# Patient Record
Sex: Female | Born: 1965 | Race: White | Hispanic: No | Marital: Married | Smoking: Never smoker
Health system: Southern US, Community
[De-identification: ages and names within clinical notes are randomized; demographics above are authoritative.]

## PROBLEM LIST (undated history)

## (undated) DIAGNOSIS — I10 Essential (primary) hypertension: Secondary | ICD-10-CM | POA: Insufficient documentation

## (undated) DIAGNOSIS — J309 Allergic rhinitis, unspecified: Secondary | ICD-10-CM

## (undated) DIAGNOSIS — K219 Gastro-esophageal reflux disease without esophagitis: Secondary | ICD-10-CM

## (undated) DIAGNOSIS — E782 Mixed hyperlipidemia: Secondary | ICD-10-CM | POA: Insufficient documentation

## (undated) DIAGNOSIS — M722 Plantar fascial fibromatosis: Secondary | ICD-10-CM | POA: Insufficient documentation

## (undated) HISTORY — DX: Gastro-esophageal reflux disease without esophagitis: K21.9

## (undated) HISTORY — DX: Allergic rhinitis, unspecified: J30.9

---
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
## 2010-10-20 HISTORY — PX: CHOLECYSTECTOMY: SHX55

## 2019-01-06 DIAGNOSIS — E8881 Metabolic syndrome: Secondary | ICD-10-CM | POA: Insufficient documentation

## 2019-01-06 DIAGNOSIS — R7303 Prediabetes: Secondary | ICD-10-CM

## 2019-01-06 DIAGNOSIS — N3941 Urge incontinence: Secondary | ICD-10-CM | POA: Insufficient documentation

## 2019-01-06 HISTORY — DX: Prediabetes: R73.03

## 2019-11-15 ENCOUNTER — Other Ambulatory Visit: Payer: Self-pay | Admitting: Legal Medicine

## 2019-11-15 DIAGNOSIS — Z1231 Encounter for screening mammogram for malignant neoplasm of breast: Secondary | ICD-10-CM

## 2019-12-22 ENCOUNTER — Ambulatory Visit
Admission: RE | Admit: 2019-12-22 | Discharge: 2019-12-22 | Disposition: A | Discharge status: Home / Self Care | Payer: 59 | Source: Ambulatory Visit | Attending: Legal Medicine | Admitting: Legal Medicine

## 2019-12-22 ENCOUNTER — Other Ambulatory Visit: Payer: Self-pay

## 2019-12-22 DIAGNOSIS — Z1231 Encounter for screening mammogram for malignant neoplasm of breast: Secondary | ICD-10-CM

## 2019-12-24 NOTE — Progress Notes (Signed)
Patient needs ultrasound and diagnostic mammogram left breast lp

## 2019-12-26 ENCOUNTER — Other Ambulatory Visit: Payer: Self-pay | Admitting: Legal Medicine

## 2019-12-26 DIAGNOSIS — R928 Other abnormal and inconclusive findings on diagnostic imaging of breast: Secondary | ICD-10-CM

## 2020-02-06 ENCOUNTER — Ambulatory Visit: Payer: Self-pay | Admitting: Legal Medicine

## 2020-02-17 ENCOUNTER — Other Ambulatory Visit: Payer: Self-pay

## 2020-02-17 ENCOUNTER — Encounter: Payer: Self-pay | Admitting: Legal Medicine

## 2020-02-17 ENCOUNTER — Ambulatory Visit (INDEPENDENT_AMBULATORY_CARE_PROVIDER_SITE_OTHER): Payer: 59 | Admitting: Legal Medicine

## 2020-02-17 VITALS — BP 112/80 | HR 82 | Temp 96.5°F | Resp 17 | Ht 63.0 in | Wt 251.4 lb

## 2020-02-17 DIAGNOSIS — N951 Menopausal and female climacteric states: Secondary | ICD-10-CM | POA: Diagnosis not present

## 2020-02-17 DIAGNOSIS — G4733 Obstructive sleep apnea (adult) (pediatric): Secondary | ICD-10-CM | POA: Insufficient documentation

## 2020-02-17 DIAGNOSIS — R7303 Prediabetes: Secondary | ICD-10-CM

## 2020-02-17 DIAGNOSIS — F325 Major depressive disorder, single episode, in full remission: Secondary | ICD-10-CM

## 2020-02-17 DIAGNOSIS — I10 Essential (primary) hypertension: Secondary | ICD-10-CM

## 2020-02-17 DIAGNOSIS — E559 Vitamin D deficiency, unspecified: Secondary | ICD-10-CM

## 2020-02-17 DIAGNOSIS — N3281 Overactive bladder: Secondary | ICD-10-CM | POA: Diagnosis not present

## 2020-02-17 DIAGNOSIS — E782 Mixed hyperlipidemia: Secondary | ICD-10-CM

## 2020-02-17 HISTORY — DX: Menopausal and female climacteric states: N95.1

## 2020-02-17 HISTORY — DX: Obstructive sleep apnea (adult) (pediatric): G47.33

## 2020-02-17 HISTORY — DX: Major depressive disorder, single episode, in full remission: F32.5

## 2020-02-17 HISTORY — DX: Overactive bladder: N32.81

## 2020-02-17 HISTORY — DX: Morbid (severe) obesity due to excess calories: E66.01

## 2020-02-17 NOTE — Progress Notes (Signed)
Established Patient Office Visit  Subjective:  Patient ID: Modestine Scherzinger, female    DOB: 1965-11-09  Age: 54 y.o. MRN: [id number]  CC:  Chief Complaint  Patient presents with  . Depression  . Sleep Apnea    HPI Ambreen Tufte presents forchronic visit  This patient has major depression for 6 months.  PHQ9 =0.  Patient is having less anhedonia.  The patient has improved future plans and prospects.  The depression is worse with stress.  The patient is not exercising and working on behavior to improve mental health.  Patient is not seeing a therapist or psychiatrist.  na  Patient is on none   Patient has gastroesophageal reflux symptoms withesophagitis and LTRD.  The symptoms are mild intensity.  Length of symptoms 5 years.  Medicines include OTC.  Complications include none.  Past Medical History:  Diagnosis Date  . Allergic rhinitis 10/20/1898  . Gastroesophageal reflux disease 10/20/1898  . Major depressive disorder, single episode, in remission (HCC) 02/17/2020  . Menopausal and female climacteric states 02/17/2020  . Morbid (severe) obesity due to excess calories (HCC) 02/17/2020  . Obstructive sleep apnea (adult) (pediatric) 02/17/2020  . Overactive bladder 02/17/2020  . Prediabetes 01/06/2019    History reviewed. No pertinent surgical history.  History reviewed. No pertinent family history.  Social History   Socioeconomic History  . Marital status: Unknown    Spouse name: Not on file  . Number of children: Not on file  . Years of education: Not on file  . Highest education level: Not on file  Occupational History  . Not on file  Tobacco Use  . Smoking status: Never Smoker  . Smokeless tobacco: Never Used  Substance and Sexual Activity  . Alcohol use: Yes    Alcohol/week: 2.0 standard drinks    Types: 2 Shots of liquor per week    Comment: every 2 weeks  . Drug use: Never  . Sexual activity: Yes    Partners: Male  Other Topics Concern  . Not on file  Social History  Narrative  . Not on file   Social Determinants of Health   Financial Resource Strain:   . Difficulty of Paying Living Expenses:   Food Insecurity:   . Worried About Programme researcher, broadcasting/film/video in the Last Year:   . Barista in the Last Year:   Transportation Needs:   . Freight forwarder (Medical):   Marland Kitchen Lack of Transportation (Non-Medical):   Physical Activity:   . Days of Exercise per Week:   . Minutes of Exercise per Session:   Stress:   . Feeling of Stress :   Social Connections:   . Frequency of Communication with Friends and Family:   . Frequency of Social Gatherings with Friends and Family:   . Attends Religious Services:   . Active Member of Clubs or Organizations:   . Attends Banker Meetings:   Marland Kitchen Marital Status:   Intimate Partner Violence:   . Fear of Current or Ex-Partner:   . Emotionally Abused:   Marland Kitchen Physically Abused:   . Sexually Abused:     Outpatient Medications Prior to Visit  Medication Sig Dispense Refill  . aspirin 81 MG EC tablet Take by mouth.    Marland Kitchen atorvastatin (LIPITOR) 40 MG tablet Take 40 mg by mouth daily.    . Cholecalciferol 125 MCG (5000 UT) TABS daily.    . Cobalamin Combinations (VITAMIN B12-FOLIC ACID) 500-400 MCG TABS daily.    Marland Kitchen  estradiol-norethindrone (ACTIVELLA) 1-0.5 MG tablet Take 1 tablet by mouth daily.    . Flaxseed, Linseed, (FLAX SEED OIL) 1000 MG CAPS flaxseed 1,000 mg capsule  Take by oral route.    Marland Kitchen lisinopril (ZESTRIL) 20 MG tablet Take 20 mg by mouth daily.    . Omega-3 Fatty Acids (FISH OIL) 1000 MG CAPS Take by mouth.     No facility-administered medications prior to visit.    No Known Allergies  ROS Review of Systems  Constitutional: Negative.   HENT: Negative.   Eyes: Negative.   Respiratory: Negative.   Cardiovascular: Negative.   Gastrointestinal: Negative.   Genitourinary: Negative.   Musculoskeletal: Negative.   Skin: Negative.   Neurological: Negative.   Psychiatric/Behavioral:  Negative.       Objective:    Physical Exam  Constitutional: She is oriented to person, place, and time. She appears well-developed and well-nourished.  HENT:  Head: Normocephalic and atraumatic.  Right Ear: External ear normal.  Left Ear: External ear normal.  Nose: Nose normal.  Mouth/Throat: Oropharynx is clear and moist.  Eyes: Pupils are equal, round, and reactive to light. Conjunctivae and EOM are normal.  Cardiovascular: Normal rate, regular rhythm, normal heart sounds and intact distal pulses.  Pulmonary/Chest: Effort normal and breath sounds normal.  Abdominal: Soft. Bowel sounds are normal.  Musculoskeletal:        General: Normal range of motion.     Cervical back: Normal range of motion and neck supple.  Neurological: She is alert and oriented to person, place, and time. She has normal reflexes.  Skin: Skin is warm and dry.  Psychiatric: She has a normal mood and affect. Her behavior is normal.  Vitals reviewed.   BP 112/80 (BP Location: Right Arm, Patient Position: Sitting)   Pulse 82   Temp (!) 96.5 F (35.8 C) (Temporal)   Resp 17   Ht 5\' 3"  (1.6 m)   Wt 251 lb 6.4 oz (114 kg)   BMI 44.53 kg/m  Wt Readings from Last 3 Encounters:  02/17/20 251 lb 6.4 oz (114 kg)     Health Maintenance Due  Topic Date Due  . HIV Screening  Never done  . COVID-19 Vaccine (1) Never done  . TETANUS/TDAP  Never done  . PAP SMEAR-Modifier  Never done  . COLONOSCOPY  Never done    There are no preventive care reminders to display for this patient.  No results found for: TSH Lab Results  Component Value Date   WBC 7.7 02/17/2020   HGB 15.0 02/17/2020   HCT 45.9 02/17/2020   MCV 95 02/17/2020   PLT 282 02/17/2020   Lab Results  Component Value Date   NA 142 02/17/2020   K 5.4 (H) 02/17/2020   CO2 23 02/17/2020   GLUCOSE 107 (H) 02/17/2020   BUN 16 02/17/2020   CREATININE 0.81 02/17/2020   BILITOT 0.7 02/17/2020   ALKPHOS 72 02/17/2020   AST 21 02/17/2020    ALT 33 (H) 02/17/2020   PROT 7.2 02/17/2020   ALBUMIN 4.5 02/17/2020   CALCIUM 10.2 02/17/2020   Lab Results  Component Value Date   CHOL 177 02/17/2020   Lab Results  Component Value Date   HDL 59 02/17/2020   Lab Results  Component Value Date   LDLCALC 101 (H) 02/17/2020   Lab Results  Component Value Date   TRIG 94 02/17/2020   Lab Results  Component Value Date   CHOLHDL 3.0 02/17/2020   Lab Results  Component Value Date   HGBA1C 5.7 (H) 02/17/2020      Assessment & Plan:   Problem List Items Addressed This Visit      Cardiovascular and Mediastinum   Benign essential hypertension    An individual hypertension care plan was established and reinforced today.  The patient's status was assessed using clinical findings on exam and labs or diagnostic tests. The patient's success at meeting treatment goals on disease specific evidence-based guidelines and found to be well controlled. SELF MANAGEMENT: The patient and I together assessed ways to personally work towards obtaining the recommended goals. RECOMMENDATIONS: avoid decongestants found in common cold remedies, decrease consumption of alcohol, perform routine monitoring of BP with home BP cuff, exercise, reduction of dietary salt, take medicines as prescribed, try not to miss doses and quit smoking.  Regular exercise and maintaining a healthy weight is needed.  Stress reduction may help. A CLINICAL SUMMARY including written plan identify barriers to care unique to individual due to social or financial issues.  We attempt to mutually creat solutions for individual and family understanding.      Relevant Medications   aspirin 81 MG EC tablet   atorvastatin (LIPITOR) 40 MG tablet   lisinopril (ZESTRIL) 20 MG tablet   Other Relevant Orders   CBC with Differential/Platelet (Completed)   Comprehensive metabolic panel (Completed)     Genitourinary   Overactive bladder    AN INDIVIDUAL CARE PLAN for OAB was established  and reinforced today.  The patient's status was assessed using clinical findings on exam, labs, and other diagnostic testing. Patient's success at meeting treatment goals based on disease specific evidence-bassed guidelines and found to be in good control. RECOMMENDATIONS include maintaining present medicines and treatment.        Other   Mixed hyperlipidemia    AN INDIVIDUAL CARE PLAN for hyperlipidemia/ cholesterol was established and reinforced today.  The patient's status was assessed using clinical findings on exam, lab and other diagnostic tests. The patient's disease status was assessed based on evidence-based guidelines and found to be well controlled. MEDICATIONS were reviewed. SELF MANAGEMENT GOALS have been discussed and patient's success at attaining the goal of low cholesterol was assessed. RECOMMENDATION given include regular exercise 3 days a week and low cholesterol/low fat diet. CLINICAL SUMMARY including written plan to identify barriers unique to the patient due to social or economic  reasons was discussed.      Relevant Medications   aspirin 81 MG EC tablet   atorvastatin (LIPITOR) 40 MG tablet   lisinopril (ZESTRIL) 20 MG tablet   Other Relevant Orders   Lipid panel (Completed)   Prediabetes - Primary    Patient has prediabetes and is staying on diet.  NO adverse effects,      Relevant Orders   Hemoglobin A1c (Completed)   Major depressive disorder, single episode, in remission (HCC)    Patient's depression is controlled with no medicines.   Anhedonia better.  PHQ 9 was was performed score 0. An individual care plan was established or reinforced today.  The patient's disease status was assessed using clinical findings on exam, labs, and or other diagnostic testing to determine patient's success in meeting treatment goals based on disease specific evidence-based guidelines and found to be improving Recommendations include stay off medicines      Menopausal and  female climacteric states    AN INDIVIDUAL CARE PLAN for menopausal symptoms was established and reinforced today.  The patient's status was assessed using clinical findings  on exam, labs, and other diagnostic testing. Patient's success at meeting treatment goals based on disease specific evidence-bassed guidelines and found to be in good control. RECOMMENDATIONS include maintaining present medicines and treatment.      Morbid (severe) obesity due to excess calories [facility])    An individualize plan was formulated using patient history and physical exam to encourage weight loss.  An evidence based program was formulated.  Patient is to cut portion size with meals and to plan physical exercise 3 days a week at least 20 minutes.  Weight watchers and other programs are helpful.  Planned amount of weight loss 10 lbs.         No orders of the defined types were placed in this encounter.   Follow-up: Return in about 4 months (around 06/18/2020) for fasting.    Brent Bulla, MD

## 2020-02-18 LAB — CBC WITH DIFFERENTIAL/PLATELET
Basophils Absolute: 0 10*3/uL (ref 0.0–0.2)
Basos: 0 %
EOS (ABSOLUTE): 0.1 10*3/uL (ref 0.0–0.4)
Eos: 2 %
Hematocrit: 45.9 % (ref 34.0–46.6)
Hemoglobin: 15 g/dL (ref 11.1–15.9)
Immature Grans (Abs): 0 10*3/uL (ref 0.0–0.1)
Immature Granulocytes: 0 %
Lymphocytes Absolute: 2.2 10*3/uL (ref 0.7–3.1)
Lymphs: 28 %
MCH: 31.1 pg (ref 26.6–33.0)
MCHC: 32.7 g/dL (ref 31.5–35.7)
MCV: 95 fL (ref 79–97)
Monocytes Absolute: 0.6 10*3/uL (ref 0.1–0.9)
Monocytes: 7 %
Neutrophils Absolute: 4.8 10*3/uL (ref 1.4–7.0)
Neutrophils: 63 %
Platelets: 282 10*3/uL (ref 150–450)
RBC: 4.83 x10E6/uL (ref 3.77–5.28)
RDW: 12.2 % (ref 11.7–15.4)
WBC: 7.7 10*3/uL (ref 3.4–10.8)

## 2020-02-18 LAB — LIPID PANEL
Chol/HDL Ratio: 3 ratio (ref 0.0–4.4)
Cholesterol, Total: 177 mg/dL (ref 100–199)
HDL: 59 mg/dL (ref >39)
LDL Chol Calc (NIH): 101 mg/dL — ABNORMAL HIGH (ref 0–99)
Triglycerides: 94 mg/dL (ref 0–149)
VLDL Cholesterol Cal: 17 mg/dL (ref 5–40)

## 2020-02-18 LAB — COMPREHENSIVE METABOLIC PANEL
ALT: 33 IU/L — ABNORMAL HIGH (ref 0–32)
AST: 21 IU/L (ref 0–40)
Albumin/Globulin Ratio: 1.7 (ref 1.2–2.2)
Albumin: 4.5 g/dL (ref 3.8–4.9)
Alkaline Phosphatase: 72 IU/L (ref 39–117)
BUN/Creatinine Ratio: 20 (ref 9–23)
BUN: 16 mg/dL (ref 6–24)
Bilirubin Total: 0.7 mg/dL (ref 0.0–1.2)
CO2: 23 mmol/L (ref 20–29)
Calcium: 10.2 mg/dL (ref 8.7–10.2)
Chloride: 104 mmol/L (ref 96–106)
Creatinine, Ser: 0.81 mg/dL (ref 0.57–1.00)
GFR calc Af Amer: 97 mL/min/{1.73_m2} (ref >59)
GFR calc non Af Amer: 84 mL/min/{1.73_m2} (ref >59)
Globulin, Total: 2.7 g/dL (ref 1.5–4.5)
Glucose: 107 mg/dL — ABNORMAL HIGH (ref 65–99)
Potassium: 5.4 mmol/L — ABNORMAL HIGH (ref 3.5–5.2)
Sodium: 142 mmol/L (ref 134–144)
Total Protein: 7.2 g/dL (ref 6.0–8.5)

## 2020-02-18 LAB — HEMOGLOBIN A1C
Est. average glucose Bld gHb Est-mCnc: 117 mg/dL
Hgb A1c MFr Bld: 5.7 % — ABNORMAL HIGH (ref 4.8–5.6)

## 2020-02-18 LAB — CARDIOVASCULAR RISK ASSESSMENT

## 2020-02-18 NOTE — Progress Notes (Signed)
CBC normal, glucose 107, potassium high at 5.4, one liver tests high, A1c 5.7- recheck  one week lp

## 2020-02-19 NOTE — Assessment & Plan Note (Signed)
AN INDIVIDUAL CARE PLAN for OAB was established and reinforced today.  The patient's status was assessed using clinical findings on exam, labs, and other diagnostic testing. Patient's success at meeting treatment goals based on disease specific evidence-bassed guidelines and found to be in good control. RECOMMENDATIONS include maintaining present medicines and treatment.

## 2020-02-19 NOTE — Assessment & Plan Note (Signed)
Patient's depression is controlled with no medicines.   Anhedonia better.  PHQ 9 was was performed score 0. An individual care plan was established or reinforced today.  The patient's disease status was assessed using clinical findings on exam, labs, and or other diagnostic testing to determine patient's success in meeting treatment goals based on disease specific evidence-based guidelines and found to be improving Recommendations include stay off medicines

## 2020-02-19 NOTE — Assessment & Plan Note (Signed)
An individualize plan was formulated using patient history and physical exam to encourage weight loss.  An evidence based program was formulated.  Patient is to cut portion size with meals and to plan physical exercise 3 days a week at least 20 minutes.  Weight watchers and other programs are helpful.  Planned amount of weight loss 10 lbs. 

## 2020-02-19 NOTE — Assessment & Plan Note (Signed)
AN INDIVIDUAL CARE PLAN for menopausal symptoms was established and reinforced today.  The patient's status was assessed using clinical findings on exam, labs, and other diagnostic testing. Patient's success at meeting treatment goals based on disease specific evidence-bassed guidelines and found to be in good control. RECOMMENDATIONS include maintaining present medicines and treatment.

## 2020-02-19 NOTE — Assessment & Plan Note (Signed)

## 2020-02-19 NOTE — Assessment & Plan Note (Signed)

## 2020-02-19 NOTE — Assessment & Plan Note (Signed)
Patient has prediabetes and is staying on diet.  NO adverse effects,

## 2020-02-20 ENCOUNTER — Other Ambulatory Visit: Payer: Self-pay | Admitting: Legal Medicine

## 2020-02-28 ENCOUNTER — Other Ambulatory Visit: Payer: Self-pay | Admitting: Legal Medicine

## 2020-02-28 DIAGNOSIS — I10 Essential (primary) hypertension: Secondary | ICD-10-CM

## 2020-05-14 ENCOUNTER — Other Ambulatory Visit: Payer: Self-pay | Admitting: Legal Medicine

## 2020-05-14 DIAGNOSIS — N951 Menopausal and female climacteric states: Secondary | ICD-10-CM

## 2020-05-25 ENCOUNTER — Other Ambulatory Visit: Payer: Self-pay

## 2020-05-25 DIAGNOSIS — E782 Mixed hyperlipidemia: Secondary | ICD-10-CM

## 2020-05-25 DIAGNOSIS — I10 Essential (primary) hypertension: Secondary | ICD-10-CM

## 2020-05-25 MED ORDER — LISINOPRIL 20 MG PO TABS: 20.0000 mg | ORAL_TABLET | Freq: Every day | ORAL | 1 refills | Status: DC

## 2020-05-25 MED ORDER — ATORVASTATIN CALCIUM 40 MG PO TABS: 40.0000 mg | ORAL_TABLET | Freq: Every day | ORAL | 1 refills | Status: DC

## 2020-05-30 ENCOUNTER — Ambulatory Visit (INDEPENDENT_AMBULATORY_CARE_PROVIDER_SITE_OTHER): Payer: 59 | Admitting: Legal Medicine

## 2020-05-30 ENCOUNTER — Other Ambulatory Visit: Payer: Self-pay

## 2020-05-30 ENCOUNTER — Encounter: Payer: Self-pay | Admitting: Legal Medicine

## 2020-05-30 DIAGNOSIS — R7303 Prediabetes: Secondary | ICD-10-CM

## 2020-05-30 DIAGNOSIS — Z0000 Encounter for general adult medical examination without abnormal findings: Secondary | ICD-10-CM

## 2020-05-30 DIAGNOSIS — E782 Mixed hyperlipidemia: Secondary | ICD-10-CM

## 2020-05-30 DIAGNOSIS — I10 Essential (primary) hypertension: Secondary | ICD-10-CM

## 2020-05-30 DIAGNOSIS — Z6841 Body Mass Index (BMI) 40.0 and over, adult: Secondary | ICD-10-CM | POA: Diagnosis not present

## 2020-05-30 DIAGNOSIS — Z01419 Encounter for gynecological examination (general) (routine) without abnormal findings: Secondary | ICD-10-CM

## 2020-05-30 MED ORDER — ACCU-CHEK GUIDE VI STRP: ORAL_STRIP | 12 refills | Status: AC

## 2020-05-30 MED ORDER — ACCU-CHEK GUIDE VI STRP: ORAL_STRIP | 12 refills | Status: DC

## 2020-05-30 NOTE — Patient Instructions (Signed)

## 2020-05-30 NOTE — Progress Notes (Signed)
Subjective:  Patient ID: Laura Hubbard, female    DOB: 1966-08-08  Age: 54 y.o. MRN: [id number]  Chief Complaint  Patient presents with  . Annual Exam    Physical exam for work.    HPI Encounter for general adult medical examination without abnormal findings  Physical ("At Risk" items are starred): Patient's last physical exam was 1 year ago .  Weight: Appropriate for height (BMI less than 27%) ;  Blood Pressure: Normal (BP less than 120/80) ;  Medical History: Patient history reviewed ; Family history reviewed ;  Allergies Reviewed: No change in current allergies ;  Medications Reviewed: Medications reviewed - no changes ;  Lipids: Normal lipid levels ;  Smoking: Life-long non-smoker ;  Physical Activity: Exercises at least 3 times per week ;  Alcohol/Drug Use: Is a non-drinker ; No illicit drug use ;  Patient is not afflicted from Stress Incontinence and Urge Incontinence  Safety: reviewed ; Patient wears a seat belt, has smoke detectors, has carbon monoxide detectors, practices appropriate gun safety, and wears sunscreen with extended sun exposure. Dental Care: biannual cleanings, brushes and flosses daily. Ophthalmology/Optometry: Annual visit.  Hearing loss: none Vision impairments: none  Patient presents for follow up of hypertension.  Patient tolerating lisinopril well with side effects.  Patient was diagnosed with hypertension 2010 so has been treated for hypertension for 10 years.Patient is working on maintaining diet and exercise regimen and follows up as directed. Complication include none  Patient presents with hyperlipidemia.  Compliance with treatment has been good; patient takes medicines as directed, maintains low cholesterol diet, follows up as directed, and maintains exercise regimen.  Patient is using aorvastatin without problems..  Prediabetes doing well, checks sugars  Fall Risk  05/30/2020  Falls in the past year? 0  Number falls in past yr: 0  Injury with  Fall? 0    Mammogram normal  Depression screen [facility] 2/9 05/30/2020 02/20/2020  Decreased Interest 0 0  Down, Depressed, Hopeless 0 0  PHQ - 2 Score 0 0  Altered sleeping 0 0  Tired, decreased energy 0 0  Change in appetite 0 0  Feeling bad or failure about yourself  0 0  Trouble concentrating 0 0  Moving slowly or fidgety/restless 0 0  Suicidal thoughts 0 0  PHQ-9 Score 0 0  Difficult doing work/chores - Not difficult at all       Functional Status Survey: done Is the patient deaf or have difficulty hearing?: No Does the patient have difficulty seeing, even when wearing glasses/contacts?: No Does the patient have difficulty concentrating, remembering, or making decisions?: No Does the patient have difficulty walking or climbing stairs?: No Does the patient have difficulty dressing or bathing?: No Does the patient have difficulty doing errands alone such as visiting a doctor's office or shopping?: No   Social Hx   Social History   Socioeconomic History  . Marital status: Unknown    Spouse name: Not on file  . Number of children: Not on file  . Years of education: Not on file  . Highest education level: Not on file  Occupational History  . Not on file  Tobacco Use  . Smoking status: Never Smoker  . Smokeless tobacco: Never Used  Substance and Sexual Activity  . Alcohol use: Yes    Alcohol/week: 2.0 standard drinks    Types: 2 Shots of liquor per week    Comment: every 2 weeks  . Drug use: Never  . Sexual activity: Yes  Partners: Male  Other Topics Concern  . Not on file  Social History Narrative  . Not on file   Social Determinants of Health   Financial Resource Strain:   . Difficulty of Paying Living Expenses:   Food Insecurity:   . Worried About Programme researcher, broadcasting/film/video in the Last Year:   . Barista in the Last Year:   Transportation Needs:   . Freight forwarder (Medical):   Marland Kitchen Lack of Transportation (Non-Medical):   Physical Activity:   . Days  of Exercise per Week:   . Minutes of Exercise per Session:   Stress:   . Feeling of Stress :   Social Connections:   . Frequency of Communication with Friends and Family:   . Frequency of Social Gatherings with Friends and Family:   . Attends Religious Services:   . Active Member of Clubs or Organizations:   . Attends Banker Meetings:   Marland Kitchen Marital Status:    Past Medical History:  Diagnosis Date  . Allergic rhinitis 10/20/1898  . Gastroesophageal reflux disease 10/20/1898  . Major depressive disorder, single episode, in remission (HCC) 02/17/2020  . Menopausal and female climacteric states 02/17/2020  . Morbid (severe) obesity due to excess calories (HCC) 02/17/2020  . Obstructive sleep apnea (adult) (pediatric) 02/17/2020  . Overactive bladder 02/17/2020  . Prediabetes 01/06/2019   History reviewed. No pertinent family history.  Review of Systems  Constitutional: Negative.   HENT: Negative.   Eyes: Negative.   Respiratory: Negative.   Cardiovascular: Negative.   Gastrointestinal: Negative.   Endocrine: Negative.   Genitourinary: Negative.   Musculoskeletal: Negative.   Skin: Negative.   Neurological: Negative.   Hematological: Negative.   Psychiatric/Behavioral: Negative.      Objective:  BP 120/90 (BP Location: Right Arm, Patient Position: Sitting)   Pulse 94   Temp 97.8 F (36.6 C) (Temporal)   Resp 16   Ht 5\' 3"  (1.6 m)   Wt 251 lb 9.6 oz (114.1 kg)   SpO2 97%   BMI 44.57 kg/m   BP/Weight 05/30/2020 02/17/2020  Systolic BP 120 112  Diastolic BP 90 80  Wt. (Lbs) 251.6 251.4  BMI 44.57 44.53    Physical Exam Vitals reviewed. Exam conducted with a chaperone present.  Constitutional:      Appearance: Normal appearance.  HENT:     Head: Normocephalic.     Right Ear: Tympanic membrane, ear canal and external ear normal.     Left Ear: Tympanic membrane, ear canal and external ear normal.     Nose: Nose normal.     Mouth/Throat:     Mouth: Mucous  membranes are moist.  Eyes:     Extraocular Movements: Extraocular movements intact.     Conjunctiva/sclera: Conjunctivae normal.     Pupils: Pupils are equal, round, and reactive to light.  Cardiovascular:     Rate and Rhythm: Normal rate.     Pulses: Normal pulses.     Heart sounds: Normal heart sounds.  Pulmonary:     Effort: Pulmonary effort is normal.     Breath sounds: Normal breath sounds.  Abdominal:     General: Abdomen is flat. Bowel sounds are normal.     Palpations: Abdomen is soft.     Hernia: There is no hernia in the left inguinal area or right inguinal area.  Genitourinary:    Exam position: Lithotomy position.     Tanner stage (genital): 5.  Labia:        Right: No rash, tenderness, lesion or injury.        Left: No rash, tenderness, lesion or injury.      Urethra: No prolapse, urethral pain, urethral swelling or urethral lesion.     Comments: Uterus antiverted, PAP performed Musculoskeletal:        General: Normal range of motion.     Cervical back: Normal range of motion and neck supple.  Lymphadenopathy:     Lower Body: No right inguinal adenopathy. No left inguinal adenopathy.  Skin:    General: Skin is warm.     Capillary Refill: Capillary refill takes less than 2 seconds.  Neurological:     General: No focal deficit present.     Mental Status: She is alert. Mental status is at baseline.  Psychiatric:        Mood and Affect: Mood normal.        Thought Content: Thought content normal.        Judgment: Judgment normal.     Lab Results  Component Value Date   WBC 7.7 02/17/2020   HGB 15.0 02/17/2020   HCT 45.9 02/17/2020   PLT 282 02/17/2020   GLUCOSE 107 (H) 02/17/2020   CHOL 177 02/17/2020   TRIG 94 02/17/2020   HDL 59 02/17/2020   LDLCALC 101 (H) 02/17/2020   ALT 33 (H) 02/17/2020   AST 21 02/17/2020   NA 142 02/17/2020   K 5.4 (H) 02/17/2020   CL 104 02/17/2020   CREATININE 0.81 02/17/2020   BUN 16 02/17/2020   CO2 23 02/17/2020     HGBA1C 5.7 (H) 02/17/2020      Assessment & Plan:  1. BMI 40.0-44.9, adult [facility]) An individualize plan was formulated for obesity using patient history and physical exam to encourage weight loss.  An evidence based program was formulated.  Patient is to cut portion size with meals and to plan physical exercise 3 days a week at least 20 minutes.  Weight watchers and other programs are helpful.  Planned amount of weight loss 10 lbs.  2. Morbid (severe) obesity due to excess calories [facility]) An individualize plan was formulated for obesity using patient history and physical exam to encourage weight loss.  An evidence based program was formulated.  Patient is to cut portion size with meals and to plan physical exercise 3 days a week at least 20 minutes.  Weight watchers and other programs are helpful.  Planned amount of weight loss 10 lbs.  3. Mixed hyperlipidemia - Lipid panel AN INDIVIDUAL CARE PLAN for hyperlipidemia/ cholesterol was established and reinforced today.  The patient's status was assessed using clinical findings on exam, lab and other diagnostic tests. The patient's disease status was assessed based on evidence-based guidelines and found to be fair controlled. MEDICATIONS were reviewed. SELF MANAGEMENT GOALS have been discussed and patient's success at attaining the goal of low cholesterol was assessed. RECOMMENDATION given include regular exercise 3 days a week and low cholesterol/low fat diet. CLINICAL SUMMARY including written plan to identify barriers unique to the patient due to social or economic  reasons was discussed.  4. Benign essential hypertension - CBC with Differential/Platelet - Comprehensive metabolic panel An individual hypertension care plan was established and reinforced today.  The patient's status was assessed using clinical findings on exam and labs or diagnostic tests. The patient's success at meeting treatment goals on disease specific evidence-based  guidelines and found to be well controlled. SELF MANAGEMENT:  The patient and I together assessed ways to personally work towards obtaining the recommended goals. RECOMMENDATIONS: avoid decongestants found in common cold remedies, decrease consumption of alcohol, perform routine monitoring of BP with home BP cuff, exercise, reduction of dietary salt, take medicines as prescribed, try not to miss doses and quit smoking.  Regular exercise and maintaining a healthy weight is needed.  Stress reduction may help. A CLINICAL SUMMARY including written plan identify barriers to care unique to individual due to social or financial issues.  We attempt to mutually creat solutions for individual and family understanding.  5. Prediabetes - Hemoglobin A1c - glucose blood (ACCU-CHEK GUIDE) test strip; One time a day fasting.  Dispense: 100 each; Refill: 12 Patient needs to keep check of her BS and lose weight  6. Encounter for cervical Pap smear with pelvic exam - IGP,CtNgTv,Apt HPV,rfx16/18,45 PAP performed  7. Routine general medical examination at a health care facility Routine exam performed, we discussed weight loss and need to keep her glucose controlled, information given    Meds ordered this encounter  Medications  . DISCONTD: glucose blood (ACCU-CHEK GUIDE) test strip    Sig: Use as instructed    Dispense:  100 each    Refill:  12  . glucose blood (ACCU-CHEK GUIDE) test strip    Sig: One time a day fasting.    Dispense:  100 each    Refill:  12    These are the goals we discussed: Goals    . DIET - REDUCE PORTION SIZE        This is a list of the screening recommended for you and due dates:  Health Maintenance  Topic Date Due  .  Hepatitis C: One time screening is recommended by Center for Disease Control  (CDC) for  adults born from [birth year] through 1963.   Never done  . HIV Screening  Never done  . Tetanus Vaccine  Never done  . Pap Smear  Never done  . Flu Shot  05/20/2020  .  COVID-19 Vaccine (2 - Pfizer 2-dose series) 06/14/2020  . Mammogram  12/21/2021  . Colon Cancer Screening  08/06/2028     AN INDIVIDUALIZED CARE PLAN: was established or reinforced today.   SELF MANAGEMENT: The patient and I together assessed ways to personally work towards obtaining the recommended goals  Support needs The patient and/or family needs were assessed and services were offered and not necessary at this time.    Follow-up: Return in about 4 months (around 09/29/2020) for fasting. Brent BullaLawrence Mckaylee [facility] [phone]

## 2020-05-31 LAB — LIPID PANEL
Chol/HDL Ratio: 2.7 ratio (ref 0.0–4.4)
Cholesterol, Total: 173 mg/dL (ref 100–199)
HDL: 65 mg/dL (ref >39)
LDL Chol Calc (NIH): 96 mg/dL (ref 0–99)
Triglycerides: 64 mg/dL (ref 0–149)
VLDL Cholesterol Cal: 12 mg/dL (ref 5–40)

## 2020-05-31 LAB — COMPREHENSIVE METABOLIC PANEL
ALT: 34 IU/L — ABNORMAL HIGH (ref 0–32)
AST: 22 IU/L (ref 0–40)
Albumin/Globulin Ratio: 1.6 (ref 1.2–2.2)
Albumin: 4.5 g/dL (ref 3.8–4.9)
Alkaline Phosphatase: 65 IU/L (ref 48–121)
BUN/Creatinine Ratio: 21 (ref 9–23)
BUN: 17 mg/dL (ref 6–24)
Bilirubin Total: 0.6 mg/dL (ref 0.0–1.2)
CO2: 26 mmol/L (ref 20–29)
Calcium: 10.5 mg/dL — ABNORMAL HIGH (ref 8.7–10.2)
Chloride: 106 mmol/L (ref 96–106)
Creatinine, Ser: 0.8 mg/dL (ref 0.57–1.00)
GFR calc Af Amer: 98 mL/min/{1.73_m2} (ref >59)
GFR calc non Af Amer: 85 mL/min/{1.73_m2} (ref >59)
Globulin, Total: 2.8 g/dL (ref 1.5–4.5)
Glucose: 115 mg/dL — ABNORMAL HIGH (ref 65–99)
Potassium: 5.6 mmol/L — ABNORMAL HIGH (ref 3.5–5.2)
Sodium: 144 mmol/L (ref 134–144)
Total Protein: 7.3 g/dL (ref 6.0–8.5)

## 2020-05-31 LAB — CBC WITH DIFFERENTIAL/PLATELET
Basophils Absolute: 0.1 10*3/uL (ref 0.0–0.2)
Basos: 1 %
EOS (ABSOLUTE): 0.3 10*3/uL (ref 0.0–0.4)
Eos: 3 %
Hematocrit: 46.5 % (ref 34.0–46.6)
Hemoglobin: 15 g/dL (ref 11.1–15.9)
Immature Grans (Abs): 0 10*3/uL (ref 0.0–0.1)
Immature Granulocytes: 0 %
Lymphocytes Absolute: 2.3 10*3/uL (ref 0.7–3.1)
Lymphs: 27 %
MCH: 30.6 pg (ref 26.6–33.0)
MCHC: 32.3 g/dL (ref 31.5–35.7)
MCV: 95 fL (ref 79–97)
Monocytes Absolute: 0.7 10*3/uL (ref 0.1–0.9)
Monocytes: 8 %
Neutrophils Absolute: 5.4 10*3/uL (ref 1.4–7.0)
Neutrophils: 61 %
Platelets: 312 10*3/uL (ref 150–450)
RBC: 4.9 x10E6/uL (ref 3.77–5.28)
RDW: 12.3 % (ref 11.7–15.4)
WBC: 8.7 10*3/uL (ref 3.4–10.8)

## 2020-05-31 LAB — CARDIOVASCULAR RISK ASSESSMENT

## 2020-05-31 LAB — HEMOGLOBIN A1C
Est. average glucose Bld gHb Est-mCnc: 120 mg/dL
Hgb A1c MFr Bld: 5.8 % — ABNORMAL HIGH (ref 4.8–5.6)

## 2020-05-31 NOTE — Progress Notes (Signed)
CBC normal, glucose 115, potassium 5.6- needs repeat in one week, calcium up, one liver test needed, Cholesterol normal, A1c 5.8 lp

## 2020-06-04 LAB — IGP,CTNGTV,APT HPV,RFX16/18,45
Chlamydia, Nuc. Acid Amp: NEGATIVE
Gonococcus, Nuc. Acid Amp: NEGATIVE
HPV Aptima: NEGATIVE
PAP Smear Comment: 0
Trich vag by NAA: NEGATIVE

## 2020-06-04 NOTE — Progress Notes (Signed)
Normal PAP lp

## 2020-06-05 ENCOUNTER — Encounter: Payer: Self-pay | Admitting: Legal Medicine

## 2020-06-18 ENCOUNTER — Other Ambulatory Visit: Payer: Self-pay

## 2020-06-18 ENCOUNTER — Ambulatory Visit (INDEPENDENT_AMBULATORY_CARE_PROVIDER_SITE_OTHER): Payer: 59 | Admitting: Legal Medicine

## 2020-06-18 ENCOUNTER — Encounter: Payer: Self-pay | Admitting: Legal Medicine

## 2020-06-18 DIAGNOSIS — I10 Essential (primary) hypertension: Secondary | ICD-10-CM | POA: Diagnosis not present

## 2020-06-18 DIAGNOSIS — R7303 Prediabetes: Secondary | ICD-10-CM

## 2020-06-18 DIAGNOSIS — E559 Vitamin D deficiency, unspecified: Secondary | ICD-10-CM

## 2020-06-18 DIAGNOSIS — N951 Menopausal and female climacteric states: Secondary | ICD-10-CM

## 2020-06-18 DIAGNOSIS — E782 Mixed hyperlipidemia: Secondary | ICD-10-CM | POA: Diagnosis not present

## 2020-06-18 DIAGNOSIS — F325 Major depressive disorder, single episode, in full remission: Secondary | ICD-10-CM

## 2020-06-18 DIAGNOSIS — N3281 Overactive bladder: Secondary | ICD-10-CM

## 2020-06-18 MED ORDER — LISINOPRIL 20 MG PO TABS: 20.0000 mg | ORAL_TABLET | Freq: Two times a day (BID) | ORAL | 2 refills | Status: DC

## 2020-06-18 NOTE — Progress Notes (Signed)
Subjective:  Patient ID: Laura Hubbard, female    DOB: 03/04/1966  Age: 54 y.o. MRN: [id number]  Chief Complaint  Patient presents with  . Hypertension  . Hyperlipidemia    HPI: chronic visit  Patient presents for follow up of hypertension.  Patient tolerating lisinopril well with side effects.  Patient was diagnosed with hypertension 2010 so has been treated for hypertension for 10 years.Patient is working on maintaining diet and exercise regimen and follows up as directed. Complication include none.  Patient presents with hyperlipidemia.  Compliance with treatment has been good; patient takes medicines as directed, maintains low cholesterol diet, follows up as directed, and maintains exercise regimen.  Patient is using atorvastatin without problems.   Current Outpatient Medications on File Prior to Visit  Medication Sig Dispense Refill  . aspirin 81 MG EC tablet Take by mouth.    Marland Kitchen atorvastatin (LIPITOR) 40 MG tablet Take 1 tablet (40 mg total) by mouth daily. 90 tablet 1  . Cholecalciferol 125 MCG (5000 UT) TABS Vitamin D3 125 mcg (5,000 unit) tablet  Take 1 tablet every day by oral route.    . Cobalamin Combinations (VITAMIN B12-FOLIC ACID) 500-400 MCG TABS daily.    . Coenzyme Q10-Red Yeast Rice (CO Q-10 PLUS RED YEAST RICE PO) co Q10-red yeast rice    . estradiol-norethindrone (ACTIVELLA) 1-0.5 MG tablet TAKE 1 TABLET BY MOUTH EVERY DAY 28 tablet 6  . glucose blood (ACCU-CHEK GUIDE) test strip One time a day fasting. 100 each 12  . Multiple Vitamins-Minerals (CENTRUM ADULTS PO) Centrum Complete    . Omega-3 Fatty Acids (FISH OIL) 1000 MG CAPS Take by mouth.     No current facility-administered medications on file prior to visit.   Past Medical History:  Diagnosis Date  . Allergic rhinitis 10/20/1898  . Gastroesophageal reflux disease 10/20/1898  . Major depressive disorder, single episode, in remission (HCC) 02/17/2020  . Menopausal and female climacteric states 02/17/2020  .  Morbid (severe) obesity due to excess calories (HCC) 02/17/2020  . Obstructive sleep apnea (adult) (pediatric) 02/17/2020  . Overactive bladder 02/17/2020  . Prediabetes 01/06/2019   Past Surgical History:  Procedure Laterality Date  . APPENDECTOMY  1981  . CHOLECYSTECTOMY  2012    History reviewed. No pertinent family history. Social History   Socioeconomic History  . Marital status: Unknown    Spouse name: Not on file  . Number of children: Not on file  . Years of education: Not on file  . Highest education level: Not on file  Occupational History  . Not on file  Tobacco Use  . Smoking status: Never Smoker  . Smokeless tobacco: Never Used  Substance and Sexual Activity  . Alcohol use: Yes    Alcohol/week: 2.0 standard drinks    Types: 2 Shots of liquor per week    Comment: every 2 weeks  . Drug use: Never  . Sexual activity: Yes    Partners: Male  Other Topics Concern  . Not on file  Social History Narrative  . Not on file   Social Determinants of Health   Financial Resource Strain:   . Difficulty of Paying Living Expenses: Not on file  Food Insecurity:   . Worried About Programme researcher, broadcasting/film/video in the Last Year: Not on file  . Ran Out of Food in the Last Year: Not on file  Transportation Needs:   . Lack of Transportation (Medical): Not on file  . Lack of Transportation (Non-Medical): Not on file  Physical Activity:   . Days of Exercise per Week: Not on file  . Minutes of Exercise per Session: Not on file  Stress:   . Feeling of Stress : Not on file  Social Connections:   . Frequency of Communication with Friends and Family: Not on file  . Frequency of Social Gatherings with Friends and Family: Not on file  . Attends Religious Services: Not on file  . Active Member of Clubs or Organizations: Not on file  . Attends Banker Meetings: Not on file  . Marital Status: Not on file    Review of Systems  Constitutional: Negative.   HENT: Negative.   Eyes:  Negative.   Respiratory: Negative for shortness of breath.   Cardiovascular: Negative for chest pain, palpitations and leg swelling.  Gastrointestinal: Negative.   Endocrine: Negative.   Genitourinary: Negative.   Neurological: Negative.      Objective:  BP 135/80   Pulse (!) 104   Ht 5\' 3"  (1.6 m)   Wt 254 lb 6.4 oz (115.4 kg)   BMI 45.06 kg/m   BP/Weight 06/18/2020 05/30/2020 02/17/2020  Systolic BP 135 120 112  Diastolic BP 80 90 80  Wt. (Lbs) 254.4 251.6 251.4  BMI 45.06 44.57 44.53    Physical Exam Vitals reviewed.  Constitutional:      Appearance: Normal appearance. She is obese.  HENT:     Head: Normocephalic.     Right Ear: Tympanic membrane, ear canal and external ear normal.     Left Ear: Tympanic membrane, ear canal and external ear normal.     Mouth/Throat:     Pharynx: Oropharynx is clear.  Eyes:     Conjunctiva/sclera: Conjunctivae normal.     Pupils: Pupils are equal, round, and reactive to light.  Cardiovascular:     Rate and Rhythm: Normal rate and regular rhythm.     Pulses: Normal pulses.     Heart sounds: Normal heart sounds.  Pulmonary:     Effort: Pulmonary effort is normal.     Breath sounds: Normal breath sounds.  Abdominal:     General: Abdomen is flat. Bowel sounds are normal.     Palpations: Abdomen is soft.  Musculoskeletal:        General: Normal range of motion.     Cervical back: Normal range of motion and neck supple.  Skin:    General: Skin is warm and dry.     Capillary Refill: Capillary refill takes less than 2 seconds.  Neurological:     General: No focal deficit present.     Mental Status: She is alert and oriented to person, place, and time.  Psychiatric:        Mood and Affect: Mood normal.        Thought Content: Thought content normal.    Depression screen [facility] 2/9 06/18/2020 05/30/2020 02/20/2020  Decreased Interest 0 0 0  Down, Depressed, Hopeless 0 0 0  PHQ - 2 Score 0 0 0  Altered sleeping 0 0 0  Tired, decreased  energy 0 0 0  Change in appetite 0 0 0  Feeling bad or failure about yourself  0 0 0  Trouble concentrating 0 0 0  Moving slowly or fidgety/restless 0 0 0  Suicidal thoughts 0 0 0  PHQ-9 Score 0 0 0  Difficult doing work/chores Not difficult at all - Not difficult at all    Diabetic Foot Exam - Simple   Simple Foot Form Diabetic Foot  exam was performed with the following findings: Yes 06/18/2020  8:55 AM  Visual Inspection No deformities, no ulcerations, no other skin breakdown bilaterally: Yes Sensation Testing Intact to touch and monofilament testing bilaterally: Yes Pulse Check Posterior Tibialis and Dorsalis pulse intact bilaterally: Yes Comments Normal feet      Lab Results  Component Value Date   WBC 8.7 05/30/2020   HGB 15.0 05/30/2020   HCT 46.5 05/30/2020   PLT 312 05/30/2020   GLUCOSE 115 (H) 05/30/2020   CHOL 173 05/30/2020   TRIG 64 05/30/2020   HDL 65 05/30/2020   LDLCALC 96 05/30/2020   ALT 34 (H) 05/30/2020   AST 22 05/30/2020   NA 144 05/30/2020   K 5.6 (H) 05/30/2020   CL 106 05/30/2020   CREATININE 0.80 05/30/2020   BUN 17 05/30/2020   CO2 26 05/30/2020   HGBA1C 5.8 (H) 05/30/2020      Assessment & Plan:   1. Morbid (severe) obesity due to excess calories [facility](HCC) An individualize plan was formulated for obesity using patient history and physical exam to encourage weight loss.  An evidence based program was formulated.  Patient is to cut portion size with meals and to plan physical exercise 3 days a week at least 20 minutes.  Weight watchers and other programs are helpful.  Planned amount of weight loss 10 lbs.  2. Mixed hyperlipidemia - TSH - Lipid panel AN INDIVIDUAL CARE PLAN for hyperlipidemia/ cholesterol was established and reinforced today.  The patient's status was assessed using clinical findings on exam, lab and other diagnostic tests. The patient's disease status was assessed based on evidence-based guidelines and found to be well  controlled. MEDICATIONS were reviewed. SELF MANAGEMENT GOALS have been discussed and patient's success at attaining the goal of low cholesterol was assessed. RECOMMENDATION given include regular exercise 3 days a week and low cholesterol/low fat diet. CLINICAL SUMMARY including written plan to identify barriers unique to the patient due to social or economic  reasons was discussed.  3. Benign essential hypertension - Comprehensive metabolic panel - CBC with Differential/Platelet - lisinopril (ZESTRIL) 20 MG tablet; Take 1 tablet (20 mg total) by mouth in the morning and at bedtime.  Dispense: 180 tablet; Refill: 2 An individual hypertension care plan was established and reinforced today.  The patient's status was assessed using clinical findings on exam and labs or diagnostic tests. The patient's success at meeting treatment goals on disease specific evidence-based guidelines and found to be well controlled. SELF MANAGEMENT: The patient and I together assessed ways to personally work towards obtaining the recommended goals. RECOMMENDATIONS: avoid decongestants found in common cold remedies, decrease consumption of alcohol, perform routine monitoring of BP with home BP cuff, exercise, reduction of dietary salt, take medicines as prescribed, try not to miss doses and quit smoking.  Regular exercise and maintaining a healthy weight is needed.  Stress reduction may help. A CLINICAL SUMMARY including written plan identify barriers to care unique to individual due to social or financial issues.  We attempt to mutually creat solutions for individual and family understanding.  4. Prediabetes - Hemoglobin A1c Patient is trying to stay on diet and exercise  5. Major depressive disorder, single episode, in remission (HCC) Patient's depression is controlled with no medicines.   Anhedonia better.  PHQ 9 was perfored score 0. An individual care plan was established or reinforced today.  The patient's disease  status was assessed using clinical findings on exam, labs, and or other diagnostic testing to  determine patient's success in meeting treatment goals based on disease specific evidence-based guidelines and found to be improving Recommendations include no changes  6. Overactive bladder OAB is stable and she is not on medicines now  7. Vitamin D deficiency - VITAMIN D 25 Hydroxy (Vit-D Deficiency, Fractures) Patient is on vitamin D supplements  8. Menopausal and female climacteric states Continue hormonal treatment.  We discussed risk vs benifits    Meds ordered this encounter  Medications  . lisinopril (ZESTRIL) 20 MG tablet    Sig: Take 1 tablet (20 mg total) by mouth in the morning and at bedtime.    Dispense:  180 tablet    Refill:  2    Orders Placed This Encounter  Procedures  . Comprehensive metabolic panel  . TSH  . Hemoglobin A1c  . Lipid panel  . CBC with Differential/Platelet  . VITAMIN D 25 Hydroxy (Vit-D Deficiency, Fractures)     Follow-up: Return in about 6 months (around 12/17/2020) for fasting.  An After Visit Summary was printed and given to the patient.  Brent Bulla [facility] [phone]

## 2020-06-19 LAB — COMPREHENSIVE METABOLIC PANEL
ALT: 28 IU/L (ref 0–32)
AST: 19 IU/L (ref 0–40)
Albumin/Globulin Ratio: 2 (ref 1.2–2.2)
Albumin: 4.3 g/dL (ref 3.8–4.9)
Alkaline Phosphatase: 64 IU/L (ref 48–121)
BUN/Creatinine Ratio: 21 (ref 9–23)
BUN: 15 mg/dL (ref 6–24)
Bilirubin Total: 0.3 mg/dL (ref 0.0–1.2)
CO2: 22 mmol/L (ref 20–29)
Calcium: 9.7 mg/dL (ref 8.7–10.2)
Chloride: 105 mmol/L (ref 96–106)
Creatinine, Ser: 0.72 mg/dL (ref 0.57–1.00)
GFR calc Af Amer: 111 mL/min/{1.73_m2} (ref >59)
GFR calc non Af Amer: 97 mL/min/{1.73_m2} (ref >59)
Globulin, Total: 2.2 g/dL (ref 1.5–4.5)
Glucose: 101 mg/dL — ABNORMAL HIGH (ref 65–99)
Potassium: 4.7 mmol/L (ref 3.5–5.2)
Sodium: 140 mmol/L (ref 134–144)
Total Protein: 6.5 g/dL (ref 6.0–8.5)

## 2020-06-19 LAB — CBC WITH DIFFERENTIAL/PLATELET
Basophils Absolute: 0 10*3/uL (ref 0.0–0.2)
Basos: 1 %
EOS (ABSOLUTE): 0.2 10*3/uL (ref 0.0–0.4)
Eos: 3 %
Hematocrit: 43.1 % (ref 34.0–46.6)
Hemoglobin: 14.2 g/dL (ref 11.1–15.9)
Immature Grans (Abs): 0 10*3/uL (ref 0.0–0.1)
Immature Granulocytes: 0 %
Lymphocytes Absolute: 2.6 10*3/uL (ref 0.7–3.1)
Lymphs: 31 %
MCH: 31.1 pg (ref 26.6–33.0)
MCHC: 32.9 g/dL (ref 31.5–35.7)
MCV: 95 fL (ref 79–97)
Monocytes Absolute: 0.6 10*3/uL (ref 0.1–0.9)
Monocytes: 7 %
Neutrophils Absolute: 4.9 10*3/uL (ref 1.4–7.0)
Neutrophils: 58 %
Platelets: 290 10*3/uL (ref 150–450)
RBC: 4.56 x10E6/uL (ref 3.77–5.28)
RDW: 12.3 % (ref 11.7–15.4)
WBC: 8.5 10*3/uL (ref 3.4–10.8)

## 2020-06-19 LAB — HEMOGLOBIN A1C
Est. average glucose Bld gHb Est-mCnc: 123 mg/dL
Hgb A1c MFr Bld: 5.9 % — ABNORMAL HIGH (ref 4.8–5.6)

## 2020-06-19 LAB — VITAMIN D 25 HYDROXY (VIT D DEFICIENCY, FRACTURES): Vit D, 25-Hydroxy: 69.2 ng/mL (ref 30.0–100.0)

## 2020-06-19 LAB — LIPID PANEL
Chol/HDL Ratio: 2.7 ratio (ref 0.0–4.4)
Cholesterol, Total: 161 mg/dL (ref 100–199)
HDL: 59 mg/dL (ref >39)
LDL Chol Calc (NIH): 90 mg/dL (ref 0–99)
Triglycerides: 62 mg/dL (ref 0–149)
VLDL Cholesterol Cal: 12 mg/dL (ref 5–40)

## 2020-06-19 LAB — TSH: TSH: 2.5 u[IU]/mL (ref 0.450–4.500)

## 2020-06-19 LAB — CARDIOVASCULAR RISK ASSESSMENT

## 2020-06-19 NOTE — Progress Notes (Signed)
Glucose 101, kidney and liver tests normal, TSH 2.55 normal, A1c 5.9 good, Cholesterol normal, cbc normal, vitamin 69.2 normal lp

## 2020-12-03 ENCOUNTER — Other Ambulatory Visit: Payer: Self-pay | Admitting: Legal Medicine

## 2020-12-03 DIAGNOSIS — E782 Mixed hyperlipidemia: Secondary | ICD-10-CM

## 2020-12-24 ENCOUNTER — Encounter: Payer: Self-pay | Admitting: Legal Medicine

## 2020-12-24 ENCOUNTER — Other Ambulatory Visit: Payer: Self-pay

## 2020-12-24 ENCOUNTER — Ambulatory Visit (INDEPENDENT_AMBULATORY_CARE_PROVIDER_SITE_OTHER): Payer: 59 | Admitting: Legal Medicine

## 2020-12-24 VITALS — BP 120/85 | HR 96 | Temp 97.4°F | Resp 16 | Ht 62.5 in | Wt 243.0 lb

## 2020-12-24 DIAGNOSIS — I10 Essential (primary) hypertension: Secondary | ICD-10-CM

## 2020-12-24 DIAGNOSIS — F325 Major depressive disorder, single episode, in full remission: Secondary | ICD-10-CM

## 2020-12-24 DIAGNOSIS — N951 Menopausal and female climacteric states: Secondary | ICD-10-CM

## 2020-12-24 DIAGNOSIS — E782 Mixed hyperlipidemia: Secondary | ICD-10-CM

## 2020-12-24 DIAGNOSIS — N3281 Overactive bladder: Secondary | ICD-10-CM

## 2020-12-24 DIAGNOSIS — E559 Vitamin D deficiency, unspecified: Secondary | ICD-10-CM

## 2020-12-24 DIAGNOSIS — R7303 Prediabetes: Secondary | ICD-10-CM

## 2020-12-24 MED ORDER — ESTRADIOL-NORETHINDRONE ACET 1-0.5 MG PO TABS: 1.0000 | ORAL_TABLET | Freq: Every day | ORAL | 2 refills | Status: DC

## 2020-12-24 MED ORDER — ATORVASTATIN CALCIUM 40 MG PO TABS: 40.0000 mg | ORAL_TABLET | Freq: Every day | ORAL | 2 refills | Status: DC

## 2020-12-24 MED ORDER — LISINOPRIL 20 MG PO TABS: 20.0000 mg | ORAL_TABLET | Freq: Two times a day (BID) | ORAL | 2 refills | Status: DC

## 2020-12-24 NOTE — Progress Notes (Signed)
Subjective:  Patient ID: Laura Hubbard, female    DOB: [birth year]  Age: 55 y.o. MRN: [id number]  Chief Complaint  Patient presents with  . Hyperlipidemia  . Hypertension    HPI: chronic visit  Patient presents for follow up of hypertension.  Patient tolerating lisinopril well with side effects.  Patient was diagnosed with hypertension 2010 so has been treated for hypertension for 10 years.Patient is working on maintaining diet and exercise regimen and follows up as directed. Complication include none.  Patient presents with hyperlipidemia.  Compliance with treatment has been good; patient takes medicines as directed, maintains low cholesterol diet, follows up as directed, and maintains exercise regimen.  Patient is using atorvastatin without problems.   Current Outpatient Medications on File Prior to Visit  Medication Sig Dispense Refill  . aspirin 81 MG EC tablet Take by mouth.    . Cholecalciferol 125 MCG (5000 UT) TABS Vitamin D3 125 mcg (5,000 unit) tablet  Take 1 tablet every day by oral route.    . Cobalamin Combinations (VITAMIN B12-FOLIC ACID) 500-400 MCG TABS daily.    . Coenzyme Q10-Red Yeast Rice (CO Q-10 PLUS RED YEAST RICE PO) co Q10-red yeast rice    . glucose blood (ACCU-CHEK GUIDE) test strip One time a day fasting. 100 each 12  . Multiple Vitamins-Minerals (CENTRUM ADULTS PO) Centrum Complete    . Omega-3 Fatty Acids (FISH OIL) 1000 MG CAPS Take by mouth.     No current facility-administered medications on file prior to visit.   Past Medical History:  Diagnosis Date  . Allergic rhinitis 10/20/1898  . Gastroesophageal reflux disease 10/20/1898  . Major depressive disorder, single episode, in remission (HCC) 02/17/2020  . Menopausal and female climacteric states 02/17/2020  . Morbid (severe) obesity due to excess calories (HCC) 02/17/2020  . Obstructive sleep apnea (adult) (pediatric) 02/17/2020  . Overactive bladder 02/17/2020  . Prediabetes 01/06/2019   Past Surgical  History:  Procedure Laterality Date  . APPENDECTOMY  1981  . CHOLECYSTECTOMY  2012    History reviewed. No pertinent family history. Social History   Socioeconomic History  . Marital status: Married    Spouse name: Not on file  . Number of children: 3  . Years of education: Not on file  . Highest education level: Not on file  Occupational History  . Occupation: Housewife  Tobacco Use  . Smoking status: Never Smoker  . Smokeless tobacco: Never Used  Substance and Sexual Activity  . Alcohol use: Yes    Alcohol/week: 2.0 standard drinks    Types: 2 Shots of liquor per week    Comment: every 2 weeks  . Drug use: Never  . Sexual activity: Yes    Partners: Male  Other Topics Concern  . Not on file  Social History Narrative  . Not on file   Social Determinants of Health   Financial Resource Strain: Not on file  Food Insecurity: Not on file  Transportation Needs: Not on file  Physical Activity: Not on file  Stress: Not on file  Social Connections: Not on file    Review of Systems  Constitutional: Negative for activity change and fatigue.  HENT: Negative for congestion and sinus pain.   Eyes: Negative for visual disturbance.  Respiratory: Negative for chest tightness and shortness of breath.   Cardiovascular: Negative for chest pain, palpitations and leg swelling.  Gastrointestinal: Negative for abdominal distention and abdominal pain.  Endocrine: Negative for polyuria.  Genitourinary: Negative for difficulty urinating, dysuria and  urgency.  Musculoskeletal: Negative for arthralgias and back pain.  Skin: Negative.   Neurological: Negative.   Psychiatric/Behavioral: Negative.      Objective:  BP 120/85   Pulse 96   Temp (!) 97.4 F (36.3 C)   Resp 16   Ht 5' 2.5" (1.588 m)   Wt 243 lb (110.2 kg)   SpO2 97%   BMI 43.74 kg/m   BP/Weight 12/24/2020 06/18/2020 05/30/2020  Systolic BP 120 135 120  Diastolic BP 85 80 90  Wt. (Lbs) 243 254.4 251.6  BMI 43.74 45.06  44.57    Physical Exam Vitals reviewed.  Constitutional:      Appearance: Normal appearance.  HENT:     Head: Normocephalic and atraumatic.     Right Ear: Tympanic membrane normal.     Left Ear: Tympanic membrane normal.     Nose: Nose normal.     Mouth/Throat:     Mouth: Mucous membranes are moist.     Pharynx: Oropharynx is clear.  Eyes:     Extraocular Movements: Extraocular movements intact.     Conjunctiva/sclera: Conjunctivae normal.     Pupils: Pupils are equal, round, and reactive to light.  Cardiovascular:     Rate and Rhythm: Normal rate and regular rhythm.     Pulses: Normal pulses.     Heart sounds: No murmur heard. No gallop.   Pulmonary:     Effort: Pulmonary effort is normal. No respiratory distress.     Breath sounds: Normal breath sounds. No rales.  Abdominal:     General: Abdomen is flat. Bowel sounds are normal. There is no distension.     Palpations: Abdomen is soft.     Tenderness: There is no abdominal tenderness.  Musculoskeletal:        General: No tenderness. Normal range of motion.     Cervical back: Normal range of motion and neck supple.  Skin:    General: Skin is warm.     Capillary Refill: Capillary refill takes less than 2 seconds.  Neurological:     General: No focal deficit present.     Mental Status: She is alert and oriented to person, place, and time. Mental status is at baseline.  Psychiatric:        Mood and Affect: Mood normal.        Thought Content: Thought content normal.    Depression screen [facility] 2/9 12/24/2020 06/18/2020 05/30/2020 02/20/2020  Decreased Interest 0 0 0 0  Down, Depressed, Hopeless 0 0 0 0  PHQ - 2 Score 0 0 0 0  Altered sleeping 1 0 0 0  Tired, decreased energy 1 0 0 0  Change in appetite 0 0 0 0  Feeling bad or failure about yourself  0 0 0 0  Trouble concentrating 0 0 0 0  Moving slowly or fidgety/restless 0 0 0 0  Suicidal thoughts 0 0 0 0  PHQ-9 Score 2 0 0 0  Difficult doing work/chores Not difficult at  all Not difficult at all - Not difficult at all     Diabetic Foot Exam - Simple   Simple Foot Form Diabetic Foot exam was performed with the following findings: Yes 12/24/2020  8:38 AM  Visual Inspection No deformities, no ulcerations, no other skin breakdown bilaterally: Yes Sensation Testing Intact to touch and monofilament testing bilaterally: Yes Pulse Check Posterior Tibialis and Dorsalis pulse intact bilaterally: Yes Comments      Lab Results  Component Value Date   WBC  8.5 06/18/2020   HGB 14.2 06/18/2020   HCT 43.1 06/18/2020   PLT 290 06/18/2020   GLUCOSE 101 (H) 06/18/2020   CHOL 161 06/18/2020   TRIG 62 06/18/2020   HDL 59 06/18/2020   LDLCALC 90 06/18/2020   ALT 28 06/18/2020   AST 19 06/18/2020   NA 140 06/18/2020   K 4.7 06/18/2020   CL 105 06/18/2020   CREATININE 0.72 06/18/2020   BUN 15 06/18/2020   CO2 22 06/18/2020   TSH 2.500 06/18/2020   HGBA1C 5.9 (H) 06/18/2020      Assessment & Plan:   1. Mixed hyperlipidemia - Lipid panel AN INDIVIDUAL CARE PLAN for hyperlipidemia/ cholesterol was established and reinforced today.  The patient's status was assessed using clinical findings on exam, lab and other diagnostic tests. The patient's disease status was assessed based on evidence-based guidelines and found to be well controlled. MEDICATIONS were reviewed. SELF MANAGEMENT GOALS have been discussed and patient's success at attaining the goal of low cholesterol was assessed. RECOMMENDATION given include regular exercise 3 days a week and low cholesterol/low fat diet. CLINICAL SUMMARY including written plan to identify barriers unique to the patient due to social or economic  reasons was discussed.  2. Benign essential hypertension - Comprehensive metabolic panel - CBC with Differential/Platelet An individual hypertension care plan was established and reinforced today.  The patient's status was assessed using clinical findings on exam and labs or  diagnostic tests. The patient's success at meeting treatment goals on disease specific evidence-based guidelines and found to be well controlled. SELF MANAGEMENT: The patient and I together assessed ways to personally work towards obtaining the recommended goals. RECOMMENDATIONS: avoid decongestants found in common cold remedies, decrease consumption of alcohol, perform routine monitoring of BP with home BP cuff, exercise, reduction of dietary salt, take medicines as prescribed, try not to miss doses and quit smoking.  Regular exercise and maintaining a healthy weight is needed.  Stress reduction may help. A CLINICAL SUMMARY including written plan identify barriers to care unique to individual due to social or financial issues.  We attempt to mutually creat solutions for individual and family understanding.  3. Major depressive disorder, single episode, in remission (HCC) Patient's depression is cntrolled with none now.   Anhedonia better.  PHQ 9 was performed score 2. An individual care plan was established or reinforced today.  The patient's disease status was assessed using clinical findings on exam, labs, and or other diagnostic testing to determine patient's success in meeting treatment goals based on disease specific evidence-based guidelines and found to be improving Recommendations include stay off medicines  4. Morbid (severe) obesity due to excess calories [facility]) An individualize plan was formulated for obesity using patient history and physical exam to encourage weight loss.  An evidence based program was formulated.  Patient is to cut portion size with meals and to plan physical exercise 3 days a week at least 20 minutes.  Weight watchers and other programs are helpful.  Planned amount of weight loss 10 lbs.  5. Prediabetes - Hemoglobin A1c Patient has prediabetes and is doing well on diet  6. Overactive bladder Patient has oab but poor responsive to medicines  7. Vitamin D  deficiency - VITAMIN D 25 Hydroxy (Vit-D Deficiency, Fractures) Chest vitamin d level  8. Menopausal and female climacteric states Patient continues on hormone replacement and doing well      Orders Placed This Encounter  Procedures  . Comprehensive metabolic panel  . Hemoglobin A1c  .  Lipid panel  . CBC with Differential/Platelet  . VITAMIN D 25 Hydroxy (Vit-D Deficiency, Fractures)      I spent 30 minutes dedicated to the care of this patient on the date of this encounter to include face-to-face time with the patient, as well as: review old records  Follow-up: Return in about 4 months (around 04/25/2021).  An After Visit Summary was printed and given to the patient.  Brent Bulla, MD [facility] [phone]

## 2020-12-25 LAB — CARDIOVASCULAR RISK ASSESSMENT

## 2020-12-25 LAB — COMPREHENSIVE METABOLIC PANEL
ALT: 33 IU/L — ABNORMAL HIGH (ref 0–32)
AST: 22 IU/L (ref 0–40)
Albumin/Globulin Ratio: 1.6 (ref 1.2–2.2)
Albumin: 4.6 g/dL (ref 3.8–4.9)
Alkaline Phosphatase: 62 IU/L (ref 44–121)
BUN/Creatinine Ratio: 20 (ref 9–23)
BUN: 16 mg/dL (ref 6–24)
Bilirubin Total: 0.6 mg/dL (ref 0.0–1.2)
CO2: 22 mmol/L (ref 20–29)
Calcium: 10.2 mg/dL (ref 8.7–10.2)
Chloride: 105 mmol/L (ref 96–106)
Creatinine, Ser: 0.79 mg/dL (ref 0.57–1.00)
Globulin, Total: 2.8 g/dL (ref 1.5–4.5)
Glucose: 115 mg/dL — ABNORMAL HIGH (ref 65–99)
Potassium: 5.8 mmol/L — ABNORMAL HIGH (ref 3.5–5.2)
Sodium: 144 mmol/L (ref 134–144)
Total Protein: 7.4 g/dL (ref 6.0–8.5)
eGFR: 89 mL/min/{1.73_m2} (ref >59)

## 2020-12-25 LAB — LIPID PANEL
Chol/HDL Ratio: 4.4 ratio (ref 0.0–4.4)
Cholesterol, Total: 266 mg/dL — ABNORMAL HIGH (ref 100–199)
HDL: 61 mg/dL (ref >39)
LDL Chol Calc (NIH): 190 mg/dL — ABNORMAL HIGH (ref 0–99)
Triglycerides: 90 mg/dL (ref 0–149)
VLDL Cholesterol Cal: 15 mg/dL (ref 5–40)

## 2020-12-25 LAB — CBC WITH DIFFERENTIAL/PLATELET
Basophils Absolute: 0 10*3/uL (ref 0.0–0.2)
Basos: 0 %
EOS (ABSOLUTE): 0.2 10*3/uL (ref 0.0–0.4)
Eos: 3 %
Hematocrit: 45 % (ref 34.0–46.6)
Hemoglobin: 14.7 g/dL (ref 11.1–15.9)
Immature Grans (Abs): 0 10*3/uL (ref 0.0–0.1)
Immature Granulocytes: 0 %
Lymphocytes Absolute: 2.1 10*3/uL (ref 0.7–3.1)
Lymphs: 29 %
MCH: 30.9 pg (ref 26.6–33.0)
MCHC: 32.7 g/dL (ref 31.5–35.7)
MCV: 95 fL (ref 79–97)
Monocytes Absolute: 0.6 10*3/uL (ref 0.1–0.9)
Monocytes: 8 %
Neutrophils Absolute: 4.2 10*3/uL (ref 1.4–7.0)
Neutrophils: 60 %
Platelets: 303 10*3/uL (ref 150–450)
RBC: 4.75 x10E6/uL (ref 3.77–5.28)
RDW: 11.9 % (ref 11.7–15.4)
WBC: 7 10*3/uL (ref 3.4–10.8)

## 2020-12-25 LAB — HEMOGLOBIN A1C
Est. average glucose Bld gHb Est-mCnc: 126 mg/dL
Hgb A1c MFr Bld: 6 % — ABNORMAL HIGH (ref 4.8–5.6)

## 2020-12-25 LAB — VITAMIN D 25 HYDROXY (VIT D DEFICIENCY, FRACTURES): Vit D, 25-Hydroxy: 70.2 ng/mL (ref 30.0–100.0)

## 2020-12-25 NOTE — Progress Notes (Signed)
Glucose 115, A1c 6.0 prediabetes, potassium high 5.8 recheck one week, no extra potassium, kidney tests ok, LDL cholesterol high 190 need statin to lower, vitamin d 70.2 normal level,  lp

## 2021-04-24 ENCOUNTER — Other Ambulatory Visit: Payer: Self-pay

## 2021-04-24 DIAGNOSIS — N951 Menopausal and female climacteric states: Secondary | ICD-10-CM

## 2021-04-24 MED ORDER — ESTRADIOL-NORETHINDRONE ACET 1-0.5 MG PO TABS: 1.0000 | ORAL_TABLET | Freq: Every day | ORAL | 2 refills | Status: DC

## 2021-04-29 ENCOUNTER — Other Ambulatory Visit: Payer: Self-pay

## 2021-04-29 DIAGNOSIS — E782 Mixed hyperlipidemia: Secondary | ICD-10-CM

## 2021-04-29 DIAGNOSIS — I10 Essential (primary) hypertension: Secondary | ICD-10-CM

## 2021-04-29 MED ORDER — ATORVASTATIN CALCIUM 40 MG PO TABS: 40.0000 mg | ORAL_TABLET | Freq: Every day | ORAL | 2 refills | Status: DC

## 2021-04-29 MED ORDER — LISINOPRIL 20 MG PO TABS: 20.0000 mg | ORAL_TABLET | Freq: Two times a day (BID) | ORAL | 2 refills | Status: DC

## 2021-05-06 ENCOUNTER — Ambulatory Visit: Payer: 59 | Admitting: Legal Medicine

## 2021-05-13 ENCOUNTER — Ambulatory Visit (INDEPENDENT_AMBULATORY_CARE_PROVIDER_SITE_OTHER): Payer: 59 | Admitting: Legal Medicine

## 2021-05-13 ENCOUNTER — Encounter: Payer: Self-pay | Admitting: Legal Medicine

## 2021-05-13 ENCOUNTER — Other Ambulatory Visit: Payer: Self-pay

## 2021-05-13 VITALS — BP 146/90 | HR 93 | Temp 97.4°F | Resp 16 | Ht 62.5 in | Wt 250.0 lb

## 2021-05-13 DIAGNOSIS — Z6841 Body Mass Index (BMI) 40.0 and over, adult: Secondary | ICD-10-CM

## 2021-05-13 DIAGNOSIS — F325 Major depressive disorder, single episode, in full remission: Secondary | ICD-10-CM

## 2021-05-13 DIAGNOSIS — R7303 Prediabetes: Secondary | ICD-10-CM | POA: Diagnosis not present

## 2021-05-13 DIAGNOSIS — I10 Essential (primary) hypertension: Secondary | ICD-10-CM

## 2021-05-13 DIAGNOSIS — E782 Mixed hyperlipidemia: Secondary | ICD-10-CM

## 2021-05-13 DIAGNOSIS — N3281 Overactive bladder: Secondary | ICD-10-CM

## 2021-05-13 MED ORDER — CHLORTHALIDONE 25 MG PO TABS: 25.0000 mg | ORAL_TABLET | Freq: Every day | ORAL | 1 refills | Status: DC

## 2021-05-13 NOTE — Progress Notes (Signed)
Established Patient Office Visit  Subjective:  Patient ID: Laura Hubbard, female    DOB: [birth year]  Age: 55 y.o. MRN: [id number]  CC:  Chief Complaint  Patient presents with   Hypertension     HPI Laura Hubbard presents for chronic visit  Patient presents for follow up of hypertension.  Patient tolerating lisinopril well with side effects.  Patient was diagnosed with hypertension 2010 so has been treated for hypertension for 10 years.Patient is working on maintaining diet and exercise regimen and follows up as directed. Complication include none  Patient presents with hyperlipidemia.  Compliance with treatment has been good; patient takes medicines as directed, maintains low cholesterol diet, follows up as directed, and maintains exercise regimen.  Patient is using atorvastatin without problems. .   Using CPAP consistently every night and medically benefiting from its use.   Prediabetes fasting BS good < 120  Past Medical History:  Diagnosis Date   Allergic rhinitis 10/20/1898   Gastroesophageal reflux disease 10/20/1898   Major depressive disorder, single episode, in remission ([place]) 02/17/2020   Menopausal and female climacteric states 02/17/2020   Morbid (severe) obesity due to excess calories ([place]) 02/17/2020   Obstructive sleep apnea (adult) (pediatric) 02/17/2020   Overactive bladder 02/17/2020   Prediabetes 01/06/2019    Past Surgical History:  Procedure Laterality Date   [place]  2012    No family history on file.  Social History   Socioeconomic History   Marital status: Married    Spouse name: Not on file   Number of children: 3   Years of education: Not on file   Highest education level: Not on file  Occupational History   Occupation: Housewife  Tobacco Use   Smoking status: Never   Smokeless tobacco: Never  Substance and Sexual Activity   Alcohol use: Yes    Alcohol/week: 2.0 standard drinks    Types: 2 Shots of liquor per week     Comment: every 2 weeks   Drug use: Never   Sexual activity: Yes    Partners: Male  Other Topics Concern   Not on file  Social History Narrative   Not on file   Social Determinants of Health   Financial Resource Strain: Not on file  Food Insecurity: Not on file  Transportation Needs: Not on file  Physical Activity: Not on file  Stress: Not on file  Social Connections: Not on file  Intimate Partner Violence: Not on file    Outpatient Medications Prior to Visit  Medication Sig Dispense Refill   aspirin 81 MG EC tablet Take by mouth.     atorvastatin (LIPITOR) 40 MG tablet Take 1 tablet (40 mg total) by mouth daily. 90 tablet 2   Cholecalciferol 125 MCG (5000 UT) TABS Vitamin D3 125 mcg (5,000 unit) tablet  Take 1 tablet every day by oral route.     Cobalamin Combinations (VITAMIN B12-FOLIC ACID) 185-631 MCG TABS daily.     Coenzyme Q10-Red Yeast Rice (CO Q-10 PLUS RED YEAST RICE PO) co Q10-red yeast rice     estradiol-norethindrone (ACTIVELLA) 1-0.5 MG tablet Take 1 tablet by mouth daily. 90 tablet 2   glucose blood (ACCU-CHEK GUIDE) test strip One time a day fasting. 100 each 12   lisinopril (ZESTRIL) 20 MG tablet Take 1 tablet (20 mg total) by mouth in the morning and at bedtime. 180 tablet 2   Multiple Vitamins-Minerals (CENTRUM ADULTS PO) Centrum Complete     Omega-3 Fatty Acids (  FISH OIL) 1000 MG CAPS Take by mouth.     No facility-administered medications prior to visit.    No Known Allergies  ROS Review of Systems  Constitutional:  Negative for activity change and appetite change.  HENT:  Negative for congestion.   Eyes:  Negative for visual disturbance.  Respiratory:  Negative for chest tightness and shortness of breath.   Cardiovascular:  Negative for chest pain, palpitations and leg swelling.  Gastrointestinal:  Negative for abdominal distention and abdominal pain.  Endocrine: Negative for polyuria.  Genitourinary:  Negative for difficulty urinating and  dysuria.  Musculoskeletal:  Negative for arthralgias and back pain.  Neurological: Negative.   Psychiatric/Behavioral: Negative.       Objective:    Physical Exam Vitals reviewed.  Constitutional:      Appearance: Normal appearance. She is obese.  HENT:     Head: Normocephalic.     Right Ear: Tympanic membrane, ear canal and external ear normal.     Left Ear: Tympanic membrane, ear canal and external ear normal.     Nose: Nose normal.     Mouth/Throat:     Mouth: Mucous membranes are moist.     Pharynx: Oropharynx is clear.  Eyes:     Extraocular Movements: Extraocular movements intact.     Conjunctiva/sclera: Conjunctivae normal.     Pupils: Pupils are equal, round, and reactive to light.  Cardiovascular:     Rate and Rhythm: Normal rate and regular rhythm.     Pulses: Normal pulses.     Heart sounds: Normal heart sounds. No murmur heard.   No gallop.  Pulmonary:     Effort: Pulmonary effort is normal. No respiratory distress.     Breath sounds: Normal breath sounds. No wheezing.  Abdominal:     General: Abdomen is flat. Bowel sounds are normal. There is no distension.     Palpations: Abdomen is soft.     Tenderness: There is no abdominal tenderness.  Musculoskeletal:        General: Normal range of motion.     Cervical back: Normal range of motion.  Skin:    General: Skin is warm and dry.     Capillary Refill: Capillary refill takes less than 2 seconds.  Neurological:     General: No focal deficit present.     Mental Status: She is alert and oriented to person, place, and time. Mental status is at baseline.  Psychiatric:        Mood and Affect: Mood normal.        Thought Content: Thought content normal.    BP (!) 146/90   Pulse 93   Temp (!) 97.4 F (36.3 C)   Resp 16   Ht 5' 2.5" (1.588 m)   Wt 250 lb (113.4 kg)   LMP 04/12/2021 (Approximate)   SpO2 97%   BMI 45.00 kg/m  Wt Readings from Last 3 Encounters:  05/13/21 250 lb (113.4 kg)  12/24/20 243 lb  (110.2 kg)  06/18/20 254 lb 6.4 oz (115.4 kg)   Depression screen [facility] 2/9 05/13/2021 12/24/2020 06/18/2020 05/30/2020 02/20/2020  Decreased Interest 0 0 0 0 0  Down, Depressed, Hopeless 0 0 0 0 0  PHQ - 2 Score 0 0 0 0 0  Altered sleeping 0 1 0 0 0  Tired, decreased energy 0 1 0 0 0  Change in appetite 0 0 0 0 0  Feeling bad or failure about yourself  0 0 0 0 0  Trouble concentrating 0 0 0 0 0  Moving slowly or fidgety/restless 0 0 0 0 0  Suicidal thoughts 0 0 0 0 0  PHQ-9 Score 0 2 0 0 0  Difficult doing work/chores Not difficult at all Not difficult at all Not difficult at all - Not difficult at all      Health Maintenance Due  Topic Date Due   HIV Screening  Never done   Hepatitis C Screening  Never done   TETANUS/TDAP  Never done   Zoster Vaccines- Shingrix (1 of 2) Never done    There are no preventive care reminders to display for this patient.  Lab Results  Component Value Date   TSH 2.500 06/18/2020   Lab Results  Component Value Date   WBC 7.0 12/24/2020   HGB 14.7 12/24/2020   HCT 45.0 12/24/2020   MCV 95 12/24/2020   PLT 303 12/24/2020   Lab Results  Component Value Date   NA 144 12/24/2020   K 5.8 (H) 12/24/2020   CO2 22 12/24/2020   GLUCOSE 115 (H) 12/24/2020   BUN 16 12/24/2020   CREATININE 0.79 12/24/2020   BILITOT 0.6 12/24/2020   ALKPHOS 62 12/24/2020   AST 22 12/24/2020   ALT 33 (H) 12/24/2020   PROT 7.4 12/24/2020   ALBUMIN 4.6 12/24/2020   CALCIUM 10.2 12/24/2020   EGFR 89 12/24/2020   Lab Results  Component Value Date   CHOL 266 (H) 12/24/2020   Lab Results  Component Value Date   HDL 61 12/24/2020   Lab Results  Component Value Date   LDLCALC 190 (H) 12/24/2020   Lab Results  Component Value Date   TRIG 90 12/24/2020   Lab Results  Component Value Date   CHOLHDL 4.4 12/24/2020   Lab Results  Component Value Date   HGBA1C 6.0 (H) 12/24/2020      Assessment & Plan:   Problem List Items Addressed This Visit        Cardiovascular and Mediastinum   Benign essential hypertension - Primary   Relevant Orders   Comprehensive metabolic panel   CBC with Differential/Platelet An individual hypertension care plan was established and reinforced today.  The patient's status was assessed using clinical findings on exam and labs or diagnostic tests. The patient's success at meeting treatment goals on disease specific evidence-based guidelines and found to be well controlled. SELF MANAGEMENT: The patient and I together assessed ways to personally work towards obtaining the recommended goals. RECOMMENDATIONS: avoid decongestants found in common cold remedies, decrease consumption of alcohol, perform routine monitoring of BP with home BP cuff, exercise, reduction of dietary salt, take medicines as prescribed, try not to miss doses and quit smoking.  Regular exercise and maintaining a healthy weight is needed.  Stress reduction may help. A CLINICAL SUMMARY including written plan identify barriers to care unique to individual due to social or financial issues.  We attempt to mutually creat solutions for individual and family understanding.      Genitourinary   Overactive bladder AN INDIVIDUAL CARE PLAN for OAB was established and reinforced today.  The patient's status was assessed using clinical findings on exam, labs, and other diagnostic testing. Patient's success at meeting treatment goals based on disease specific evidence-bassed guidelines and found to be in good control. RECOMMENDATIONS include maintaining present medicines and treatment.      Other   Mixed hyperlipidemia   Relevant Orders   Lipid panel AN INDIVIDUAL CARE PLAN for hyperlipidemia/ cholesterol was established  and reinforced today.  The patient's status was assessed using clinical findings on exam, lab and other diagnostic tests. The patient's disease status was assessed based on evidence-based guidelines and found to be fair controlled. MEDICATIONS were  reviewed. SELF MANAGEMENT GOALS have been discussed and patient's success at attaining the goal of low cholesterol was assessed. RECOMMENDATION given include regular exercise 3 days a week and low cholesterol/low fat diet. CLINICAL SUMMARY including written plan to identify barriers unique to the patient due to social or economic  reasons was discussed.     Prediabetes   Relevant Orders   Hemoglobin A1c Patient has prediabetes and doing well    Major depressive disorder, single episode, in remission ([place]) Patient's depression is controlled with no medicines.   Anhedonia improved.  PHQ 9 was performed score 0. An individual care plan was established or reinforced today.  The patient's disease status was assessed using clinical findings on exam, labs, and or other diagnostic testing to determine patient's success in meeting treatment goals based on disease specific evidence-based guidelines and found to be improving Recommendations include stay off medicines     Morbid (severe) obesity due to excess calories [facility]) An individualize plan was formulated for obesity using patient history and physical exam to encourage weight loss.  An evidence based program was formulated.  Patient is to cut portion size with meals and to plan physical exercise 3 days a week at least 20 minutes.  Weight watchers and other programs are helpful.  Planned amount of weight loss 10 lbs.    BMI 40.0-44.9, adult [facility]) An individualize plan was formulated for obesity using patient history and physical exam to encourage weight loss.  An evidence based program was formulated.  Patient is to cut portion size with meals and to plan physical exercise 3 days a week at least 20 minutes.  Weight watchers and other programs are helpful.  Planned amount of weight loss 10 lbs.        Follow-up: Return 2 weeks nurse Check BP.    Reinaldo Meeker, MD

## 2021-05-14 LAB — COMPREHENSIVE METABOLIC PANEL
ALT: 31 IU/L (ref 0–32)
AST: 18 IU/L (ref 0–40)
Albumin/Globulin Ratio: 1.9 (ref 1.2–2.2)
Albumin: 4.7 g/dL (ref 3.8–4.9)
Alkaline Phosphatase: 67 IU/L (ref 44–121)
BUN/Creatinine Ratio: 17 (ref 9–23)
BUN: 14 mg/dL (ref 6–24)
Bilirubin Total: 0.5 mg/dL (ref 0.0–1.2)
CO2: 25 mmol/L (ref 20–29)
Calcium: 10.4 mg/dL — ABNORMAL HIGH (ref 8.7–10.2)
Chloride: 105 mmol/L (ref 96–106)
Creatinine, Ser: 0.82 mg/dL (ref 0.57–1.00)
Globulin, Total: 2.5 g/dL (ref 1.5–4.5)
Glucose: 102 mg/dL — ABNORMAL HIGH (ref 65–99)
Potassium: 5.3 mmol/L — ABNORMAL HIGH (ref 3.5–5.2)
Sodium: 144 mmol/L (ref 134–144)
Total Protein: 7.2 g/dL (ref 6.0–8.5)
eGFR: 85 mL/min/{1.73_m2} (ref >59)

## 2021-05-14 LAB — HEMOGLOBIN A1C
Est. average glucose Bld gHb Est-mCnc: 123 mg/dL
Hgb A1c MFr Bld: 5.9 % — ABNORMAL HIGH (ref 4.8–5.6)

## 2021-05-14 LAB — CBC WITH DIFFERENTIAL/PLATELET
Basophils Absolute: 0 10*3/uL (ref 0.0–0.2)
Basos: 1 %
EOS (ABSOLUTE): 0.2 10*3/uL (ref 0.0–0.4)
Eos: 3 %
Hematocrit: 44 % (ref 34.0–46.6)
Hemoglobin: 14.6 g/dL (ref 11.1–15.9)
Immature Grans (Abs): 0 10*3/uL (ref 0.0–0.1)
Immature Granulocytes: 0 %
Lymphocytes Absolute: 2.3 10*3/uL (ref 0.7–3.1)
Lymphs: 28 %
MCH: 31.4 pg (ref 26.6–33.0)
MCHC: 33.2 g/dL (ref 31.5–35.7)
MCV: 95 fL (ref 79–97)
Monocytes Absolute: 0.6 10*3/uL (ref 0.1–0.9)
Monocytes: 7 %
Neutrophils Absolute: 5.1 10*3/uL (ref 1.4–7.0)
Neutrophils: 61 %
Platelets: 266 10*3/uL (ref 150–450)
RBC: 4.65 x10E6/uL (ref 3.77–5.28)
RDW: 12.1 % (ref 11.7–15.4)
WBC: 8.2 10*3/uL (ref 3.4–10.8)

## 2021-05-14 LAB — LIPID PANEL
Chol/HDL Ratio: 2.8 ratio (ref 0.0–4.4)
Cholesterol, Total: 201 mg/dL — ABNORMAL HIGH (ref 100–199)
HDL: 72 mg/dL (ref >39)
LDL Chol Calc (NIH): 114 mg/dL — ABNORMAL HIGH (ref 0–99)
Triglycerides: 86 mg/dL (ref 0–149)
VLDL Cholesterol Cal: 15 mg/dL (ref 5–40)

## 2021-05-14 LAB — CARDIOVASCULAR RISK ASSESSMENT

## 2021-05-14 NOTE — Progress Notes (Signed)
Glucose 102, potassium 5.3 high, recheck one week, do not take potassium supplements, calcium a little high, liver tests normal, A1c 5.9 still prediabetes range, Cholesterol LDL cholesterol high at 114,  lp

## 2021-05-15 ENCOUNTER — Other Ambulatory Visit: Payer: Self-pay

## 2021-05-15 DIAGNOSIS — E875 Hyperkalemia: Secondary | ICD-10-CM

## 2021-05-24 ENCOUNTER — Ambulatory Visit: Payer: 59

## 2021-05-24 ENCOUNTER — Other Ambulatory Visit: Payer: Self-pay

## 2021-05-24 VITALS — BP 120/80 | Ht 62.0 in

## 2021-05-24 DIAGNOSIS — Z0130 Encounter for examination of blood pressure without abnormal findings: Secondary | ICD-10-CM

## 2021-05-24 NOTE — Patient Instructions (Signed)
Patient came in for a Blood pressure check and it was 120/80. I spoke with Dr. Aurther Loft and he states to continue doing what she is doing.

## 2021-07-30 ENCOUNTER — Other Ambulatory Visit: Payer: Self-pay | Admitting: Legal Medicine

## 2021-07-30 DIAGNOSIS — I10 Essential (primary) hypertension: Secondary | ICD-10-CM

## 2021-07-30 NOTE — Telephone Encounter (Signed)
Refill sent to pharmacy.   

## 2021-09-08 DIAGNOSIS — F5102 Adjustment insomnia: Secondary | ICD-10-CM | POA: Insufficient documentation

## 2021-09-09 ENCOUNTER — Ambulatory Visit (INDEPENDENT_AMBULATORY_CARE_PROVIDER_SITE_OTHER): Payer: 59 | Admitting: Legal Medicine

## 2021-09-09 ENCOUNTER — Encounter: Payer: Self-pay | Admitting: Legal Medicine

## 2021-09-09 ENCOUNTER — Other Ambulatory Visit: Payer: Self-pay

## 2021-09-09 VITALS — BP 110/70 | HR 104 | Temp 97.6°F | Resp 16 | Ht 62.0 in | Wt 256.0 lb

## 2021-09-09 DIAGNOSIS — E782 Mixed hyperlipidemia: Secondary | ICD-10-CM

## 2021-09-09 DIAGNOSIS — G4733 Obstructive sleep apnea (adult) (pediatric): Secondary | ICD-10-CM

## 2021-09-09 DIAGNOSIS — I10 Essential (primary) hypertension: Secondary | ICD-10-CM | POA: Diagnosis not present

## 2021-09-09 DIAGNOSIS — Z6841 Body Mass Index (BMI) 40.0 and over, adult: Secondary | ICD-10-CM

## 2021-09-09 DIAGNOSIS — F325 Major depressive disorder, single episode, in full remission: Secondary | ICD-10-CM

## 2021-09-09 DIAGNOSIS — K219 Gastro-esophageal reflux disease without esophagitis: Secondary | ICD-10-CM

## 2021-09-09 DIAGNOSIS — N3281 Overactive bladder: Secondary | ICD-10-CM | POA: Diagnosis not present

## 2021-09-09 DIAGNOSIS — R7303 Prediabetes: Secondary | ICD-10-CM

## 2021-09-09 NOTE — Progress Notes (Signed)
Subjective:  Patient ID: Laura Hubbard, female    DOB: 1966/03/11  Age: 55 y.o. MRN: [id number]  Chief Complaint  Patient presents with   Hyperlipidemia   Prediabetes   Hypertension     HPI: chronic visit  Patient presents for follow up of hypertension.  Patient tolerating hygroton well with side effects.  Patient was diagnosed with hypertension 2021 so has been treated for hypertension for 12 years.Patient is working on maintaining diet and exercise regimen and follows up as directed. Complication include none.   Patient presents with hyperlipidemia.  Compliance with treatment has been good; patient takes medicines as directed, maintains low cholesterol diet, follows up as directed, and maintains exercise regimen.  Patient is using atorvastatin without problems.   Prediabetes on diet  OSA- no using CPAP   Current Outpatient Medications on File Prior to Visit  Medication Sig Dispense Refill   aspirin 81 MG EC tablet Take by mouth.     atorvastatin (LIPITOR) 40 MG tablet Take 1 tablet (40 mg total) by mouth daily. 90 tablet 2   chlorthalidone (HYGROTON) 25 MG tablet TAKE 1 TABLET(25 MG) BY MOUTH DAILY 90 tablet 1   Cholecalciferol 125 MCG (5000 UT) TABS Vitamin D3 125 mcg (5,000 unit) tablet  Take 1 tablet every day by oral route.     Cobalamin Combinations (VITAMIN B12-FOLIC ACID) 500-400 MCG TABS daily.     Coenzyme Q10-Red Yeast Rice (CO Q-10 PLUS RED YEAST RICE PO) co Q10-red yeast rice     estradiol-norethindrone (ACTIVELLA) 1-0.5 MG tablet Take 1 tablet by mouth daily. 90 tablet 2   glucose blood (ACCU-CHEK GUIDE) test strip One time a day fasting. 100 each 12   lisinopril (ZESTRIL) 20 MG tablet Take 1 tablet (20 mg total) by mouth in the morning and at bedtime. 180 tablet 2   Multiple Vitamins-Minerals (CENTRUM ADULTS PO) Centrum Complete     Omega-3 Fatty Acids (FISH OIL) 1000 MG CAPS Take by mouth.     No current facility-administered medications on file prior to visit.    Past Medical History:  Diagnosis Date   Allergic rhinitis 10/20/1898   Gastroesophageal reflux disease 10/20/1898   Major depressive disorder, single episode, in remission (HCC) 02/17/2020   Menopausal and female climacteric states 02/17/2020   Morbid (severe) obesity due to excess calories (HCC) 02/17/2020   Obstructive sleep apnea (adult) (pediatric) 02/17/2020   Overactive bladder 02/17/2020   Prediabetes 01/06/2019   Past Surgical History:  Procedure Laterality Date   APPENDECTOMY  1981   CHOLECYSTECTOMY  2012    History reviewed. No pertinent family history. Social History   Socioeconomic History   Marital status: Married    Spouse name: Not on file   Number of children: 3   Years of education: Not on file   Highest education level: Not on file  Occupational History   Occupation: Housewife  Tobacco Use   Smoking status: Never   Smokeless tobacco: Never  Substance and Sexual Activity   Alcohol use: Yes    Alcohol/week: 2.0 standard drinks    Types: 2 Shots of liquor per week    Comment: every 2 weeks   Drug use: Never   Sexual activity: Yes    Partners: Male  Other Topics Concern   Not on file  Social History Narrative   Not on file   Social Determinants of Health   Financial Resource Strain: Not on file  Food Insecurity: Not on file  Transportation Needs: Not on file  Physical Activity: Not on file  Stress: Not on file  Social Connections: Not on file    Review of Systems  Constitutional:  Negative for chills, fatigue and fever.  HENT:  Positive for rhinorrhea. Negative for congestion, ear pain and sore throat.   Respiratory:  Negative for cough and shortness of breath.   Cardiovascular:  Negative for chest pain and palpitations.  Gastrointestinal:  Negative for abdominal pain, constipation, diarrhea, nausea and vomiting.  Endocrine: Negative for polydipsia, polyphagia and polyuria.  Genitourinary:  Negative for difficulty urinating and dysuria.   Musculoskeletal:  Negative for arthralgias, back pain and myalgias.  Skin:  Negative for rash.  Neurological:  Negative for headaches.  Psychiatric/Behavioral:  Negative for dysphoric mood. The patient is not nervous/anxious.     Objective:  BP 110/70   Pulse (!) 104   Temp 97.6 F (36.4 C)   Resp 16   Ht 5\' 2"  (1.575 m)   Wt 256 lb (116.1 kg)   LMP  (LMP Unknown)   SpO2 96%   BMI 46.82 kg/m   BP/Weight 09/09/2021 05/24/2021 05/13/2021  Systolic BP 110 120 146  Diastolic BP 70 80 90  Wt. (Lbs) 256 - 250  BMI 46.82 45.73 45    Physical Exam Vitals reviewed.  Constitutional:      General: She is not in acute distress.    Appearance: Normal appearance.  HENT:     Head: Normocephalic and atraumatic.     Right Ear: Tympanic membrane, ear canal and external ear normal.     Left Ear: Tympanic membrane, ear canal and external ear normal.     Mouth/Throat:     Mouth: Mucous membranes are moist.     Pharynx: Oropharynx is clear.  Eyes:     Extraocular Movements: Extraocular movements intact.     Conjunctiva/sclera: Conjunctivae normal.     Pupils: Pupils are equal, round, and reactive to light.  Cardiovascular:     Rate and Rhythm: Normal rate.     Pulses: Normal pulses.     Heart sounds: Normal heart sounds. No murmur heard.   No gallop.  Pulmonary:     Effort: Pulmonary effort is normal. No respiratory distress.     Breath sounds: Normal breath sounds. No wheezing.  Abdominal:     General: Abdomen is flat. Bowel sounds are normal. There is no distension.     Tenderness: There is no abdominal tenderness.  Musculoskeletal:        General: Normal range of motion.     Cervical back: Normal range of motion and neck supple.     Right lower leg: No edema.     Left lower leg: No edema.  Skin:    General: Skin is warm.     Capillary Refill: Capillary refill takes less than 2 seconds.  Neurological:     General: No focal deficit present.     Mental Status: She is alert and  oriented to person, place, and time.  Psychiatric:        Mood and Affect: Mood normal.    Diabetic Foot Exam - Simple   Simple Foot Form Diabetic Foot exam was performed with the following findings: Yes 09/09/2021  8:39 AM  Visual Inspection No deformities, no ulcerations, no other skin breakdown bilaterally: Yes Sensation Testing Intact to touch and monofilament testing bilaterally: Yes Pulse Check Posterior Tibialis and Dorsalis pulse intact bilaterally: Yes Comments      Lab Results  Component Value Date  WBC 8.2 05/13/2021   HGB 14.6 05/13/2021   HCT 44.0 05/13/2021   PLT 266 05/13/2021   GLUCOSE 102 (H) 05/13/2021   CHOL 201 (H) 05/13/2021   TRIG 86 05/13/2021   HDL 72 05/13/2021   LDLCALC 114 (H) 05/13/2021   ALT 31 05/13/2021   AST 18 05/13/2021   NA 144 05/13/2021   K 5.3 (H) 05/13/2021   CL 105 05/13/2021   CREATININE 0.82 05/13/2021   BUN 14 05/13/2021   CO2 25 05/13/2021   TSH 2.500 06/18/2020   HGBA1C 5.9 (H) 05/13/2021   Depression screen PHQ 2/9 05/13/2021 12/24/2020 06/18/2020 05/30/2020 02/20/2020  Decreased Interest 0 0 0 0 0  Down, Depressed, Hopeless 0 0 0 0 0  PHQ - 2 Score 0 0 0 0 0  Altered sleeping 0 1 0 0 0  Tired, decreased energy 0 1 0 0 0  Change in appetite 0 0 0 0 0  Feeling bad or failure about yourself  0 0 0 0 0  Trouble concentrating 0 0 0 0 0  Moving slowly or fidgety/restless 0 0 0 0 0  Suicidal thoughts 0 0 0 0 0  PHQ-9 Score 0 2 0 0 0  Difficult doing work/chores Not difficult at all Not difficult at all Not difficult at all - Not difficult at all       Assessment & Plan:   Problem List Items Addressed This Visit       Cardiovascular and Mediastinum   Benign essential hypertension - Primary   Relevant Orders   Comprehensive metabolic panel   CBC with Differential/Platelet An individual hypertension care plan was established and reinforced today.  The patient's status was assessed using clinical findings on exam and  labs or diagnostic tests. The patient's success at meeting treatment goals on disease specific evidence-based guidelines and found to be well controlled. SELF MANAGEMENT: The patient and I together assessed ways to personally work towards obtaining the recommended goals. RECOMMENDATIONS: avoid decongestants found in common cold remedies, decrease consumption of alcohol, perform routine monitoring of BP with home BP cuff, exercise, reduction of dietary salt, take medicines as prescribed, try not to miss doses and quit smoking.  Regular exercise and maintaining a healthy weight is needed.  Stress reduction may help. A CLINICAL SUMMARY including written plan identify barriers to care unique to individual due to social or financial issues.  We attempt to mutually creat solutions for individual and family understanding.      Respiratory   Obstructive sleep apnea (adult) (pediatric) Patient has OSA but not using CPAP, does not like     Digestive   Gastroesophageal reflux disease Plan of care was formulated today.  She is doing well.  A plan of care was formulated using patient exam, tests and other sources to optimize care using evidence based information.  Recommend no smoking, no eating after supper, avoid fatty foods, elevate Head of bed, avoid tight fitting clothing.  Continue on OTC.      Genitourinary   Overactive bladder Patient has OAB on medicines     Other   Mixed hyperlipidemia   Relevant Orders   Lipid panel   TSH AN INDIVIDUAL CARE PLAN for hyperlipidemia/ cholesterol was established and reinforced today.  The patient's status was assessed using clinical findings on exam, lab and other diagnostic tests. The patient's disease status was assessed based on evidence-based guidelines and found to be well controlled. MEDICATIONS were reviewed. SELF MANAGEMENT GOALS have been discussed and  patient's success at attaining the goal of low cholesterol was assessed. RECOMMENDATION given include  regular exercise 3 days a week and low cholesterol/low fat diet. CLINICAL SUMMARY including written plan to identify barriers unique to the patient due to social or economic  reasons was discussed.    Prediabetes   Relevant Orders   Hemoglobin A1c Patient is on diet, check A1c    Major depressive disorder, single episode, in remission (HCC) Patient's depression is controlled with no antidepressant.   Anhedonia better.  PHQ 9 was performed score 0. An individual care plan was established or reinforced today.  The patient's disease status was assessed using clinical findings on exam, labs, and or other diagnostic testing to determine patient's success in meeting treatment goals based on disease specific evidence-based guidelines and found to be improving Recommendations include stop any antidepressants    Morbid (severe) obesity due to excess calories [facility]) An individualize plan was formulated for obesity using patient history and physical exam to encourage weight loss.  An evidence based program was formulated.  Patient is to cut portion size with meals and to plan physical exercise 3 days a week at least 20 minutes.  Weight watchers and other programs are helpful.  Planned amount of weight loss 10 lbs. With hypertension and hyperlipidemia meets criteria for morbid obesity    BMI 40.0-44.9, adult [facility]) An individualize plan was formulated for obesity using patient history and physical exam to encourage weight loss.  An evidence based program was formulated.  Patient is to cut portion size with meals and to plan physical exercise 3 days a week at least 20 minutes.  Weight watchers and other programs are helpful.  Planned amount of weight loss 10 lbs.   .     Orders Placed This Encounter  Procedures   Comprehensive metabolic panel   Hemoglobin A1c   Lipid panel   CBC with Differential/Platelet   TSH  30 minute visit, we discussed weight loss with Ozempic, she will consider    Follow-up:  Return in about 4 months (around 01/07/2022) for fasting.  An After Visit Summary was printed and given to the patient.  Brent Bulla, MD [facility] [phone]

## 2021-09-10 LAB — CBC WITH DIFFERENTIAL/PLATELET
Basophils Absolute: 0 10*3/uL (ref 0.0–0.2)
Basos: 0 %
EOS (ABSOLUTE): 0.1 10*3/uL (ref 0.0–0.4)
Eos: 2 %
Hematocrit: 43.2 % (ref 34.0–46.6)
Hemoglobin: 14.3 g/dL (ref 11.1–15.9)
Immature Grans (Abs): 0 10*3/uL (ref 0.0–0.1)
Immature Granulocytes: 0 %
Lymphocytes Absolute: 2.2 10*3/uL (ref 0.7–3.1)
Lymphs: 29 %
MCH: 31.8 pg (ref 26.6–33.0)
MCHC: 33.1 g/dL (ref 31.5–35.7)
MCV: 96 fL (ref 79–97)
Monocytes Absolute: 0.6 10*3/uL (ref 0.1–0.9)
Monocytes: 8 %
Neutrophils Absolute: 4.5 10*3/uL (ref 1.4–7.0)
Neutrophils: 61 %
Platelets: 283 10*3/uL (ref 150–450)
RBC: 4.5 x10E6/uL (ref 3.77–5.28)
RDW: 12 % (ref 11.7–15.4)
WBC: 7.4 10*3/uL (ref 3.4–10.8)

## 2021-09-10 LAB — TSH: TSH: 1.58 u[IU]/mL (ref 0.450–4.500)

## 2021-09-10 LAB — COMPREHENSIVE METABOLIC PANEL
ALT: 27 IU/L (ref 0–32)
AST: 16 IU/L (ref 0–40)
Albumin/Globulin Ratio: 1.7 (ref 1.2–2.2)
Albumin: 4.3 g/dL (ref 3.8–4.9)
Alkaline Phosphatase: 58 IU/L (ref 44–121)
BUN/Creatinine Ratio: 18 (ref 9–23)
BUN: 16 mg/dL (ref 6–24)
Bilirubin Total: 0.6 mg/dL (ref 0.0–1.2)
CO2: 26 mmol/L (ref 20–29)
Calcium: 10.3 mg/dL — ABNORMAL HIGH (ref 8.7–10.2)
Chloride: 104 mmol/L (ref 96–106)
Creatinine, Ser: 0.88 mg/dL (ref 0.57–1.00)
Globulin, Total: 2.6 g/dL (ref 1.5–4.5)
Glucose: 106 mg/dL — ABNORMAL HIGH (ref 70–99)
Potassium: 4.9 mmol/L (ref 3.5–5.2)
Sodium: 141 mmol/L (ref 134–144)
Total Protein: 6.9 g/dL (ref 6.0–8.5)
eGFR: 79 mL/min/{1.73_m2} (ref >59)

## 2021-09-10 LAB — LIPID PANEL
Chol/HDL Ratio: 2.8 ratio (ref 0.0–4.4)
Cholesterol, Total: 178 mg/dL (ref 100–199)
HDL: 63 mg/dL (ref >39)
LDL Chol Calc (NIH): 97 mg/dL (ref 0–99)
Triglycerides: 101 mg/dL (ref 0–149)
VLDL Cholesterol Cal: 18 mg/dL (ref 5–40)

## 2021-09-10 LAB — HEMOGLOBIN A1C
Est. average glucose Bld gHb Est-mCnc: 120 mg/dL
Hgb A1c MFr Bld: 5.8 % — ABNORMAL HIGH (ref 4.8–5.6)

## 2021-09-10 LAB — CARDIOVASCULAR RISK ASSESSMENT

## 2021-09-10 NOTE — Progress Notes (Signed)
Glucose 106, kidney tests normal, calcium high 10.3 check pTH, liver tests normal, A1c 5.8, Cholesterol normal, CBC normal, TSH not performed? lp

## 2021-12-12 ENCOUNTER — Other Ambulatory Visit: Payer: Self-pay | Admitting: Family Medicine

## 2021-12-12 DIAGNOSIS — N951 Menopausal and female climacteric states: Secondary | ICD-10-CM

## 2022-01-03 NOTE — Progress Notes (Signed)
Subjective:  Patient ID: Laura Hubbard, female    DOB: [birth year]  Age: 56 y.o. MRN: [id number]  Chief Complaint  Patient presents with   Hypertension   Hyperlipidemia   Prediabetes    HPI Hypertension: She takes Aspirin 81 mg daily, Lisinopril 20 mg twice a day, Chlorthalidone 25 mg daily. Patient presents for follow up of hypertension.  Patient tolerating lisinopril/ chlorthalidone well with side effects.  Patient was diagnosed with hypertension 2010 so has been treated for hypertension for 12 years.Patient is working on maintaining diet and exercise regimen and follows up as directed. Complication include none.   Hyperlipidemia: Patient is taking Atorvastatin 40 mg daily. Patient presents with hyperlipidemia.  Compliance with treatment has been good; patient takes medicines as directed, maintains low cholesterol diet, follows up as directed, and maintains exercise regimen.  Patient is using atorvastatin without problems.   Prediabetes: Patient is eating more healthy and low carbs diet. Blood sugar range are 90-110      Current Outpatient Medications on File Prior to Visit  Medication Sig Dispense Refill   aspirin 81 MG EC tablet Take by mouth.     atorvastatin (LIPITOR) 40 MG tablet Take 1 tablet (40 mg total) by mouth daily. 90 tablet 2   Biotin 1000 MCG tablet Take 1 tablet every day by oral route as directed for 30 days.     chlorthalidone (HYGROTON) 25 MG tablet TAKE 1 TABLET(25 MG) BY MOUTH DAILY 90 tablet 1   Cholecalciferol 125 MCG (5000 UT) TABS Vitamin D3 125 mcg (5,000 unit) tablet  Take 1 tablet every day by oral route.     Cobalamin Combinations (VITAMIN B12-FOLIC ACID) 500-400 MCG TABS daily.     glucose blood (ACCU-CHEK GUIDE) test strip One time a day fasting. 100 each 12   lisinopril (ZESTRIL) 20 MG tablet Take 1 tablet (20 mg total) by mouth in the morning and at bedtime. 180 tablet 2   Multiple Vitamins-Minerals (CENTRUM ADULTS PO) Centrum Complete     No  current facility-administered medications on file prior to visit.   Past Medical History:  Diagnosis Date   Allergic rhinitis 10/20/1898   Gastroesophageal reflux disease 10/20/1898   Major depressive disorder, single episode, in remission (HCC) 02/17/2020   Menopausal and female climacteric states 02/17/2020   Morbid (severe) obesity due to excess calories (HCC) 02/17/2020   Obstructive sleep apnea (adult) (pediatric) 02/17/2020   Overactive bladder 02/17/2020   Prediabetes 01/06/2019   Past Surgical History:  Procedure Laterality Date   APPENDECTOMY  1981   CHOLECYSTECTOMY  2012    History reviewed. No pertinent family history. Social History   Socioeconomic History   Marital status: Married    Spouse name: Not on file   Number of children: 3   Years of education: Not on file   Highest education level: Not on file  Occupational History   Occupation: Housewife  Tobacco Use   Smoking status: Never   Smokeless tobacco: Never  Substance and Sexual Activity   Alcohol use: Yes    Alcohol/week: 2.0 standard drinks    Types: 2 Shots of liquor per week    Comment: every 2 weeks   Drug use: Never   Sexual activity: Yes    Partners: Male  Other Topics Concern   Not on file  Social History Narrative   Not on file   Social Determinants of Health   Financial Resource Strain: Not on file  Food Insecurity: Not on file  Transportation Needs:  Not on file  Physical Activity: Not on file  Stress: Not on file  Social Connections: Not on file    Review of Systems  Constitutional:  Negative for chills, fatigue and fever.  HENT:  Negative for congestion, ear pain and sore throat.   Eyes:  Negative for visual disturbance.  Respiratory:  Negative for cough and shortness of breath.   Cardiovascular:  Negative for chest pain and palpitations.  Gastrointestinal:  Negative for abdominal pain, constipation, diarrhea, nausea and vomiting.  Endocrine: Negative for polydipsia, polyphagia and  polyuria.  Genitourinary:  Negative for difficulty urinating and dysuria.  Musculoskeletal:  Negative for arthralgias, back pain and myalgias.  Skin:  Negative for rash.  Neurological:  Negative for headaches.  Psychiatric/Behavioral:  Negative for dysphoric mood. The patient is not nervous/anxious.     Objective:  BP 110/80   Pulse (!) 117   Temp 98.7 F (37.1 C)   Resp 15   Ht 5\' 2"  (1.575 m)   Wt 263 lb (119.3 kg)   SpO2 95%   BMI 48.10 kg/m   BP/Weight 01/06/2022 09/09/2021 05/24/2021  Systolic BP 110 110 120  Diastolic BP 80 70 80  Wt. (Lbs) 263 256 -  BMI 48.1 46.82 45.73    Physical Exam Vitals reviewed.  Constitutional:      General: She is not in acute distress.    Appearance: Normal appearance. She is obese.  HENT:     Head: Normocephalic.     Right Ear: Tympanic membrane normal.     Left Ear: Tympanic membrane normal.     Mouth/Throat:     Mouth: Mucous membranes are moist.     Pharynx: Oropharynx is clear.  Eyes:     Extraocular Movements: Extraocular movements intact.     Conjunctiva/sclera: Conjunctivae normal.     Pupils: Pupils are equal, round, and reactive to light.  Cardiovascular:     Rate and Rhythm: Normal rate and regular rhythm.     Pulses: Normal pulses.     Heart sounds: Normal heart sounds. No murmur heard.   No gallop.  Pulmonary:     Effort: Pulmonary effort is normal. No respiratory distress.     Breath sounds: Normal breath sounds. No wheezing.  Abdominal:     General: Abdomen is flat. Bowel sounds are normal. There is no distension.     Tenderness: There is no abdominal tenderness.  Musculoskeletal:        General: Normal range of motion.     Cervical back: Normal range of motion and neck supple.     Right lower leg: No edema.     Left lower leg: No edema.  Skin:    General: Skin is warm.     Capillary Refill: Capillary refill takes less than 2 seconds.  Neurological:     General: No focal deficit present.     Mental Status:  She is alert and oriented to person, place, and time. Mental status is at baseline.     Gait: Gait normal.     Deep Tendon Reflexes: Reflexes normal.  Psychiatric:        Mood and Affect: Mood normal.        Thought Content: Thought content normal.        Lab Results  Component Value Date   WBC 7.4 09/09/2021   HGB 14.3 09/09/2021   HCT 43.2 09/09/2021   PLT 283 09/09/2021   GLUCOSE 106 (H) 09/09/2021   CHOL 178 09/09/2021  TRIG 101 09/09/2021   HDL 63 09/09/2021   LDLCALC 97 09/09/2021   ALT 27 09/09/2021   AST 16 09/09/2021   NA 141 09/09/2021   K 4.9 09/09/2021   CL 104 09/09/2021   CREATININE 0.88 09/09/2021   BUN 16 09/09/2021   CO2 26 09/09/2021   TSH 1.580 09/09/2021   HGBA1C 5.8 (H) 09/09/2021   Depression screen PHQ 2/9 01/06/2022 05/13/2021 12/24/2020 06/18/2020 05/30/2020  Decreased Interest 0 0 0 0 0  Down, Depressed, Hopeless 0 0 0 0 0  PHQ - 2 Score 0 0 0 0 0  Altered sleeping 0 0 1 0 0  Tired, decreased energy 0 0 1 0 0  Change in appetite 0 0 0 0 0  Feeling bad or failure about yourself  0 0 0 0 0  Trouble concentrating 0 0 0 0 0  Moving slowly or fidgety/restless 0 0 0 0 0  Suicidal thoughts 0 0 0 0 0  PHQ-9 Score 0 0 2 0 0  Difficult doing work/chores Not difficult at all Not difficult at all Not difficult at all Not difficult at all -      Assessment & Plan:   Problem List Items Addressed This Visit       Cardiovascular and Mediastinum   Benign essential hypertension - Primary   Relevant Orders   Comprehensive metabolic panel   CBC with Differential/Platelet An individual hypertension care plan was established and reinforced today.  The patient's status was assessed using clinical findings on exam and labs or diagnostic tests. The patient's success at meeting treatment goals on disease specific evidence-based guidelines and found to be well controlled. SELF MANAGEMENT: The patient and I together assessed ways to personally work towards  obtaining the recommended goals. RECOMMENDATIONS: avoid decongestants found in common cold remedies, decrease consumption of alcohol, perform routine monitoring of BP with home BP cuff, exercise, reduction of dietary salt, take medicines as prescribed, try not to miss doses and quit smoking.  Regular exercise and maintaining a healthy weight is needed.  Stress reduction may help. A CLINICAL SUMMARY including written plan identify barriers to care unique to individual due to social or financial issues.  We attempt to mutually creat solutions for individual and family understanding.      Respiratory   Obstructive sleep apnea (adult) (pediatric) Using CPAP consistently every night and medically benefiting from its use.      Digestive   Gastroesophageal reflux disease Plan of care was formulated today.  She is doing well.  A plan of care was formulated using patient exam, tests and other sources to optimize care using evidence based information.  Recommend no smoking, no eating after supper, avoid fatty foods, elevate Head of bed, avoid tight fitting clothing.  Continue on OTC.      Genitourinary   Overactive bladder Patient has OAB but is stable     Other   Mixed hyperlipidemia   Relevant Orders   Lipid panel AN INDIVIDUAL CARE PLAN for hyperlipidemia/ cholesterol was established and reinforced today.  The patient's status was assessed using clinical findings on exam, lab and other diagnostic tests. The patient's disease status was assessed based on evidence-based guidelines and found to be well controlled. MEDICATIONS were reviewed. SELF MANAGEMENT GOALS have been discussed and patient's success at attaining the goal of low cholesterol was assessed. RECOMMENDATION given include regular exercise 3 days a week and low cholesterol/low fat diet. CLINICAL SUMMARY including written plan to identify barriers unique to  the patient due to social or economic  reasons was discussed.     Prediabetes    Relevant Orders   Hemoglobin A1c Patient on diet with reasonable control    Major depressive disorder, single episode, in remission (HCC) Patient's depression is controlled with no medicines.   Anhedonia better.  PHQ 9 was performed score zero. An individual care plan was established or reinforced today.  The patient's disease status was assessed using clinical findings on exam, labs, and or other diagnostic testing to determine patient's success in meeting treatment goals based on disease specific evidence-based guidelines and found to be stable and doing well off medicines    Menopausal and female climacteric states   Relevant Orders   MM Digital Screening Schedule mammogram    BMI 40.0-44.9, adult (HCC)- morbid obesity An individualize plan was formulated for obesity using patient history and physical exam to encourage weight loss.  An evidence based program was formulated.  Patient is to cut portion size with meals and to plan physical exercise 3 days a week at least 20 minutes.  Weight watchers and other programs are helpful.  Planned amount of weight loss 10 lbs.    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  An After Visit Summary was printed and given to the patient.  Brent Bulla, MD [facility] [phone]

## 2022-01-06 ENCOUNTER — Other Ambulatory Visit: Payer: Self-pay

## 2022-01-06 ENCOUNTER — Encounter: Payer: Self-pay | Admitting: Legal Medicine

## 2022-01-06 ENCOUNTER — Ambulatory Visit (INDEPENDENT_AMBULATORY_CARE_PROVIDER_SITE_OTHER): Payer: 59 | Admitting: Legal Medicine

## 2022-01-06 VITALS — BP 110/80 | HR 117 | Temp 98.7°F | Resp 15 | Ht 62.0 in | Wt 263.0 lb

## 2022-01-06 DIAGNOSIS — I10 Essential (primary) hypertension: Secondary | ICD-10-CM

## 2022-01-06 DIAGNOSIS — F325 Major depressive disorder, single episode, in full remission: Secondary | ICD-10-CM

## 2022-01-06 DIAGNOSIS — G4733 Obstructive sleep apnea (adult) (pediatric): Secondary | ICD-10-CM

## 2022-01-06 DIAGNOSIS — Z6841 Body Mass Index (BMI) 40.0 and over, adult: Secondary | ICD-10-CM

## 2022-01-06 DIAGNOSIS — N951 Menopausal and female climacteric states: Secondary | ICD-10-CM

## 2022-01-06 DIAGNOSIS — F5102 Adjustment insomnia: Secondary | ICD-10-CM

## 2022-01-06 DIAGNOSIS — Z1159 Encounter for screening for other viral diseases: Secondary | ICD-10-CM

## 2022-01-06 DIAGNOSIS — K219 Gastro-esophageal reflux disease without esophagitis: Secondary | ICD-10-CM | POA: Diagnosis not present

## 2022-01-06 DIAGNOSIS — R7303 Prediabetes: Secondary | ICD-10-CM

## 2022-01-06 DIAGNOSIS — E782 Mixed hyperlipidemia: Secondary | ICD-10-CM

## 2022-01-06 DIAGNOSIS — N3281 Overactive bladder: Secondary | ICD-10-CM

## 2022-01-06 DIAGNOSIS — Z114 Encounter for screening for human immunodeficiency virus [HIV]: Secondary | ICD-10-CM

## 2022-01-07 ENCOUNTER — Ambulatory Visit: Payer: 59 | Admitting: Legal Medicine

## 2022-01-07 ENCOUNTER — Other Ambulatory Visit: Payer: Self-pay | Admitting: Legal Medicine

## 2022-01-07 DIAGNOSIS — E782 Mixed hyperlipidemia: Secondary | ICD-10-CM

## 2022-01-07 LAB — COMPREHENSIVE METABOLIC PANEL
ALT: 31 IU/L (ref 0–32)
AST: 20 IU/L (ref 0–40)
Albumin/Globulin Ratio: 1.8 (ref 1.2–2.2)
Albumin: 4.6 g/dL (ref 3.8–4.9)
Alkaline Phosphatase: 66 IU/L (ref 44–121)
BUN/Creatinine Ratio: 23 (ref 9–23)
BUN: 17 mg/dL (ref 6–24)
Bilirubin Total: 0.5 mg/dL (ref 0.0–1.2)
CO2: 26 mmol/L (ref 20–29)
Calcium: 10.4 mg/dL — ABNORMAL HIGH (ref 8.7–10.2)
Chloride: 102 mmol/L (ref 96–106)
Creatinine, Ser: 0.74 mg/dL (ref 0.57–1.00)
Globulin, Total: 2.5 g/dL (ref 1.5–4.5)
Glucose: 114 mg/dL — ABNORMAL HIGH (ref 70–99)
Potassium: 4.3 mmol/L (ref 3.5–5.2)
Sodium: 143 mmol/L (ref 134–144)
Total Protein: 7.1 g/dL (ref 6.0–8.5)
eGFR: 96 mL/min/{1.73_m2} (ref >59)

## 2022-01-07 LAB — CBC WITH DIFFERENTIAL/PLATELET
Basophils Absolute: 0 10*3/uL (ref 0.0–0.2)
Basos: 1 %
EOS (ABSOLUTE): 0.2 10*3/uL (ref 0.0–0.4)
Eos: 2 %
Hematocrit: 43.8 % (ref 34.0–46.6)
Hemoglobin: 14.6 g/dL (ref 11.1–15.9)
Immature Grans (Abs): 0 10*3/uL (ref 0.0–0.1)
Immature Granulocytes: 0 %
Lymphocytes Absolute: 2.1 10*3/uL (ref 0.7–3.1)
Lymphs: 26 %
MCH: 31 pg (ref 26.6–33.0)
MCHC: 33.3 g/dL (ref 31.5–35.7)
MCV: 93 fL (ref 79–97)
Monocytes Absolute: 0.6 10*3/uL (ref 0.1–0.9)
Monocytes: 8 %
Neutrophils Absolute: 5.1 10*3/uL (ref 1.4–7.0)
Neutrophils: 63 %
Platelets: 285 10*3/uL (ref 150–450)
RBC: 4.71 x10E6/uL (ref 3.77–5.28)
RDW: 12.1 % (ref 11.7–15.4)
WBC: 8.1 10*3/uL (ref 3.4–10.8)

## 2022-01-07 LAB — HEMOGLOBIN A1C
Est. average glucose Bld gHb Est-mCnc: 126 mg/dL
Hgb A1c MFr Bld: 6 % — ABNORMAL HIGH (ref 4.8–5.6)

## 2022-01-07 LAB — LIPID PANEL
Chol/HDL Ratio: 2.8 ratio (ref 0.0–4.4)
Cholesterol, Total: 193 mg/dL (ref 100–199)
HDL: 68 mg/dL (ref >39)
LDL Chol Calc (NIH): 109 mg/dL — ABNORMAL HIGH (ref 0–99)
Triglycerides: 87 mg/dL (ref 0–149)
VLDL Cholesterol Cal: 16 mg/dL (ref 5–40)

## 2022-01-07 LAB — HIV ANTIBODY (ROUTINE TESTING W REFLEX): HIV Screen 4th Generation wRfx: NONREACTIVE

## 2022-01-07 LAB — CARDIOVASCULAR RISK ASSESSMENT

## 2022-01-07 LAB — HEPATITIS C ANTIBODY: Hep C Virus Ab: NONREACTIVE

## 2022-01-07 MED ORDER — ROSUVASTATIN CALCIUM 20 MG PO TABS: 20.0000 mg | ORAL_TABLET | Freq: Every day | ORAL | 3 refills | Status: DC

## 2022-01-07 NOTE — Progress Notes (Signed)
Glucose 114, kidney tests normal, potassium normal, calcium still 104. Liver tests normal, A1c 6.0, LDL cholesterol high 109, may need crestor, hepatitis c negative, HIV negative, CBC normal ?lp

## 2022-01-09 ENCOUNTER — Other Ambulatory Visit: Payer: Self-pay | Admitting: Legal Medicine

## 2022-01-09 DIAGNOSIS — I10 Essential (primary) hypertension: Secondary | ICD-10-CM

## 2022-01-09 NOTE — Telephone Encounter (Signed)
Refill sent to pharmacy.   

## 2022-01-28 ENCOUNTER — Other Ambulatory Visit: Payer: Self-pay | Admitting: Legal Medicine

## 2022-01-28 DIAGNOSIS — I10 Essential (primary) hypertension: Secondary | ICD-10-CM

## 2022-02-19 ENCOUNTER — Other Ambulatory Visit: Payer: Self-pay | Admitting: Legal Medicine

## 2022-02-19 DIAGNOSIS — E782 Mixed hyperlipidemia: Secondary | ICD-10-CM

## 2022-02-23 IMAGING — MG DIGITAL SCREENING BILAT W/ TOMO W/ CAD
6 of 10 series · 6 of 30 positions shown · non-contrast
Comparison: Previous exam(s).

CLINICAL DATA: Screening.

EXAM:
DIGITAL SCREENING BILATERAL MAMMOGRAM WITH TOMO AND CAD

[L CC synth-2D]
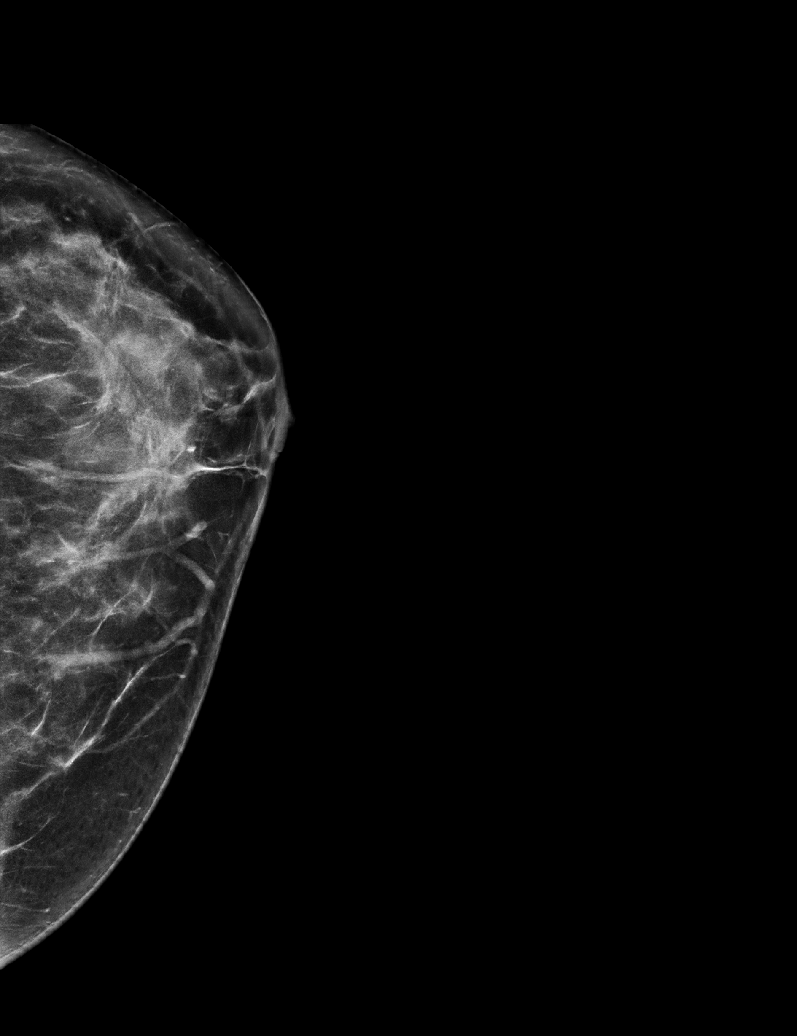

[R CC synth-2D]
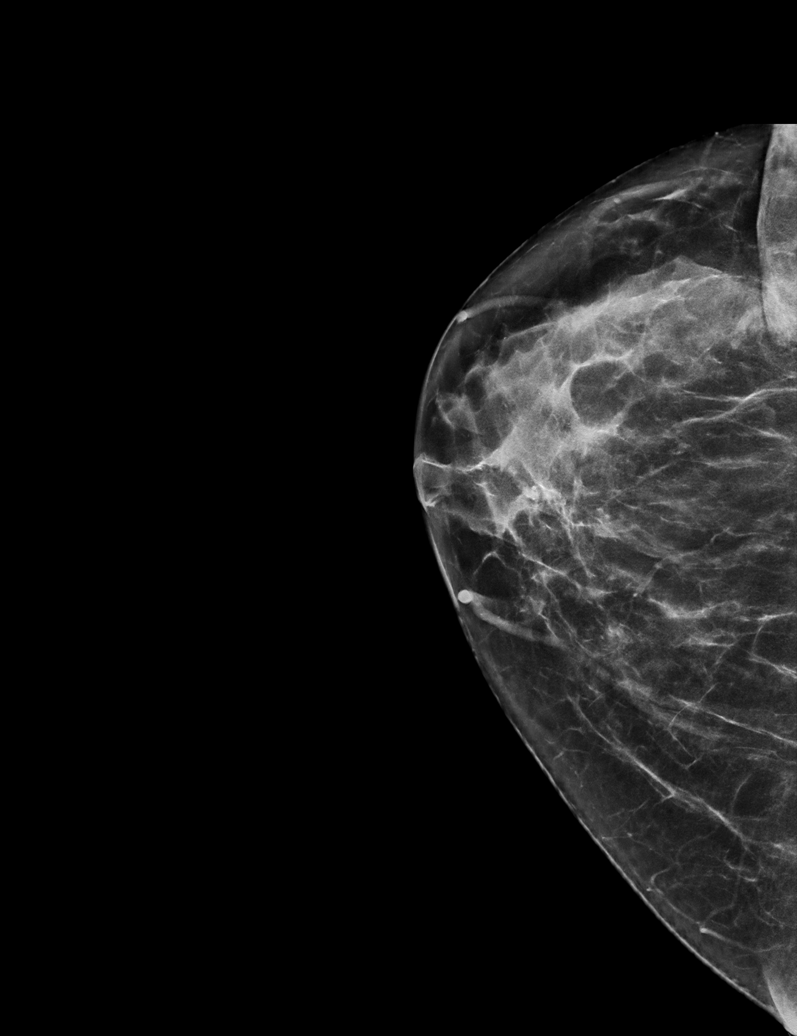

[R MLO synth-2D]
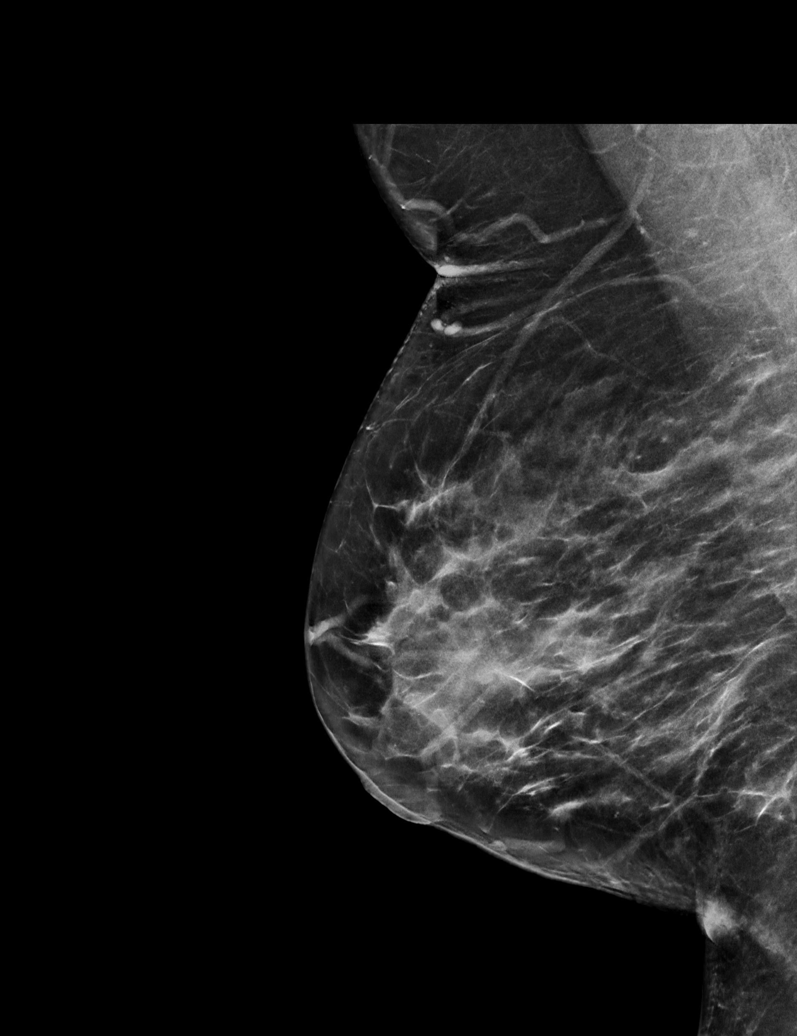

[L MLO synth-2D (1 of 2)]
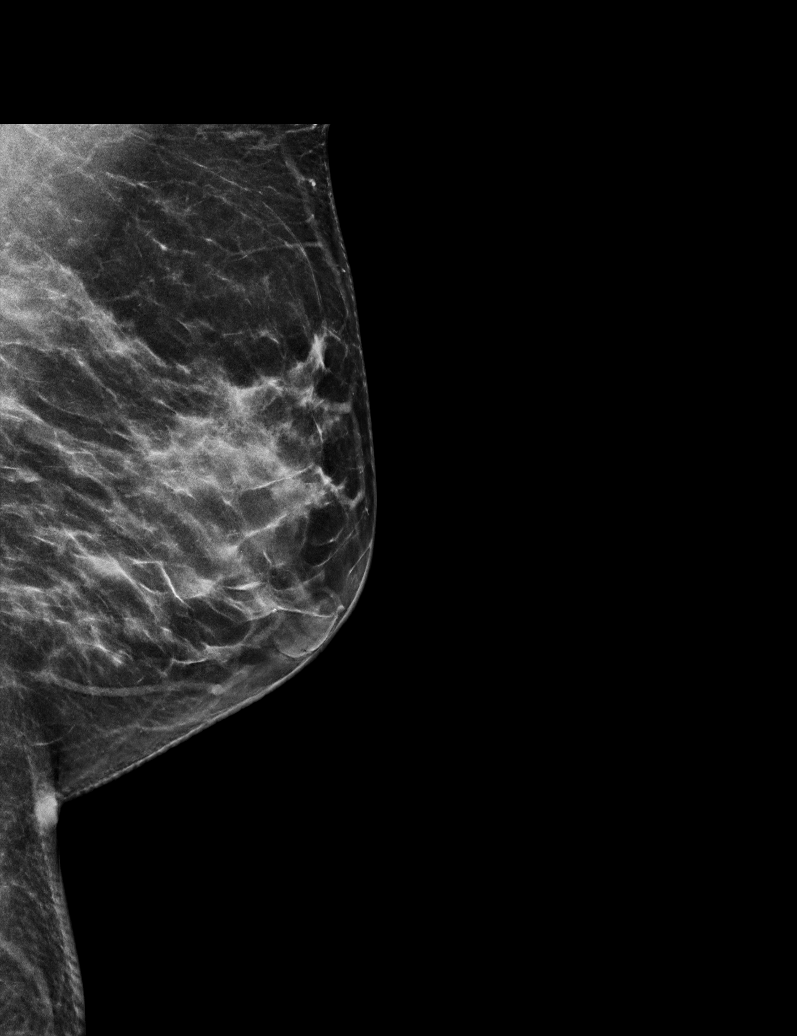

[L MLO synth-2D (2 of 2)]
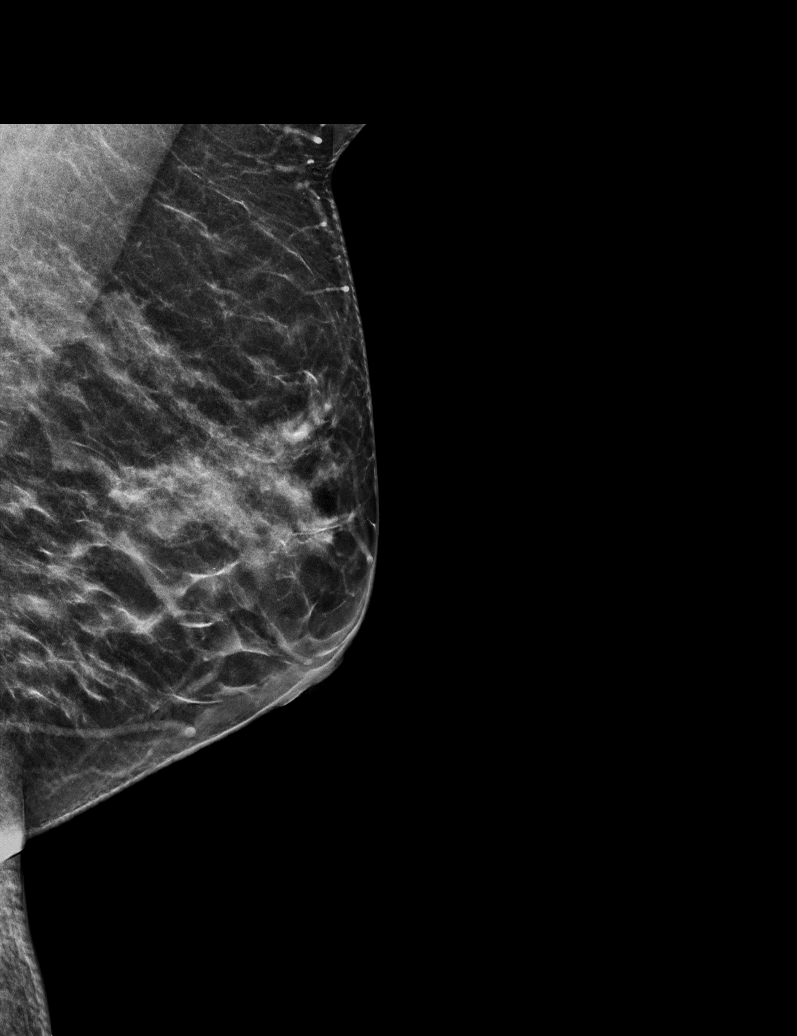

[R CC tomo · tomo slice 33/64.0]
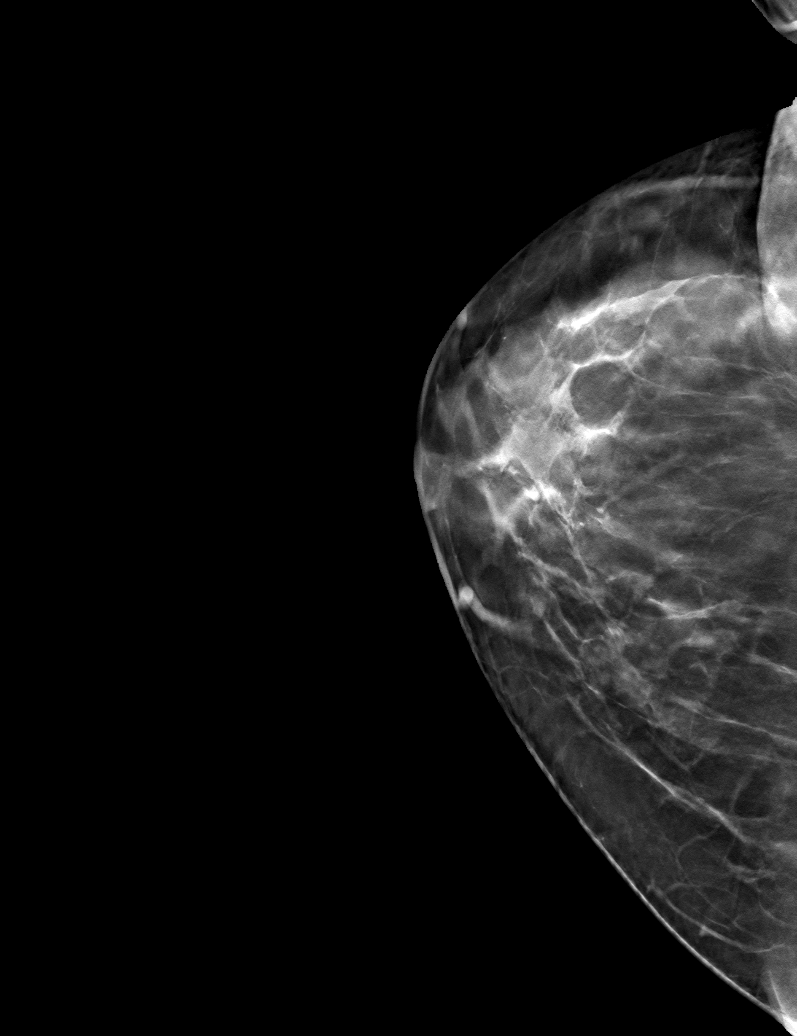

[6 of 30 positions shown; findings below may reference images not displayed]

ACR Breast Density Category c: The breast tissue is heterogeneously
dense, which may obscure small masses.
FINDINGS: In the left breast, possible distortion warrants further evaluation.
In the right breast, no findings suspicious for malignancy. Images
were processed with CAD.
IMPRESSION: Further evaluation is suggested for possible distortion in the left
breast.

RECOMMENDATION:
Diagnostic mammogram and possibly ultrasound of the left breast.
(Code:[id number])

BI-RADS CATEGORY  0: Incomplete. Need additional imaging evaluation
and/or prior mammograms for comparison.

## 2022-07-13 NOTE — Progress Notes (Signed)
Established Patient Office Visit  Subjective   Patient ID: Laura Hubbard, female    DOB: [birth year]  Age: 56 y.o. MRN: [id number]  CC: Prediabetes HTN Hyperlipidemia  HPI Pt presents for follow-up of prediabetes, HTN, and hyperlipidemia. She is past due for mammogram, last one 12/23/2019 revealed left breast asymmetry, diagnostic mammogram/US recommended. Underwent screening colonoscopy with Dr Melina Copa 08/06/2018, normal pap smear 05/30/2020.   Pt reports hot flashes and night sweats related to menopausal changes. Was recently treated with Activella. She does not want to continue hormone therapy due to risk of side effects.   Prediabetes, Follow-up  Lab Results  Component Value Date   HGBA1C 6.0 (H) 01/06/2022   HGBA1C 5.8 (H) 09/09/2021   HGBA1C 5.9 (H) 05/13/2021   GLUCOSE 114 (H) 01/06/2022   GLUCOSE 106 (H) 09/09/2021   GLUCOSE 102 (H) 05/13/2021    Last seen for for this 6 months ago.  Management since that visit includes diet modification. Current symptoms include none and have been stable. Prior visit with dietician: no Current diet: well balanced Current exercise: none  Pertinent Labs:    Component Value Date/Time   CHOL 193 01/06/2022 0902   TRIG 87 01/06/2022 0902   CHOLHDL 2.8 01/06/2022 0902   CREATININE 0.74 01/06/2022 0902    Wt Readings from Last 3 Encounters:  07/14/22 273 lb (123.8 kg)  01/06/22 263 lb (119.3 kg)  09/09/21 256 lb (116.1 kg)     Hypertension, follow-up: She was last seen for hypertension 6 months ago.  BP at that visit was 110/80. Management includes Lisinopril 20 mg BID, Chlorthalidone 25 mg QD. She reports good compliance with treatment. She is not having side effects.  She is following a Regular diet. She is not exercising. She does not smoke. Use of agents associated with hypertension: NSAIDS.  Outside blood pressures are not being checked.  Pertinent labs: Lab Results  Component Value Date   CHOL 193 01/06/2022    HDL 68 01/06/2022   LDLCALC 109 (H) 01/06/2022   TRIG 87 01/06/2022   CHOLHDL 2.8 01/06/2022   Lab Results  Component Value Date   NA 143 01/06/2022   K 4.3 01/06/2022   CREATININE 0.74 01/06/2022   EGFR 96 01/06/2022   GFRNONAA 97 06/18/2020   GLUCOSE 114 (H) 01/06/2022     The 10-year ASCVD risk score (Arnett DK, et al., 2019) is: 1.4%     Lipid/Cholesterol, Follow-up  Last lipid panel Other pertinent labs  Lab Results  Component Value Date   CHOL 193 01/06/2022   HDL 68 01/06/2022   LDLCALC 109 (H) 01/06/2022   TRIG 87 01/06/2022   CHOLHDL 2.8 01/06/2022   Lab Results  Component Value Date   ALT 31 01/06/2022   AST 20 01/06/2022   PLT 285 01/06/2022   TSH 1.580 09/09/2021     She was last seen for this 6 months ago.  Management includes Crestor 20 mg QD. She reports good compliance with treatment. She is not having side effects.  Current diet: well balanced Current exercise: none The 10-year ASCVD risk score (Arnett DK, et al., 2019) is: 1.4%   Vitamin D deficiency, follow-up  Lab Results  Component Value Date   VD25OH 70.2 12/24/2020   VD25OH 69.2 06/18/2020   CALCIUM 10.4 (H) 01/06/2022   CALCIUM 10.3 (H) 09/09/2021   Wt Readings from Last 3 Encounters:  07/14/22 273 lb (123.8 kg)  01/06/22 263 lb (119.3 kg)  09/09/21 256 lb (116.1 kg)  She was last seen for vitamin D deficiency 6 months ago.  Management since that visit includes Vit D 5, 000 U daily. She reports good compliance with treatment. She is not having side effects.     Review of Systems  Constitutional:  Positive for diaphoresis (night sweats). Negative for chills, fever and malaise/fatigue.  HENT:  Negative for ear pain, sinus pain and sore throat.   Respiratory:  Negative for cough and shortness of breath.   Cardiovascular:  Negative for chest pain.  Genitourinary:  Positive for frequency.       Nocturia- gets up 3-5 times a night  Musculoskeletal:  Negative for myalgias.   Neurological:  Negative for headaches.      Objective:     BP 128/74   Pulse 89   Temp (!) 96.8 F (36 C)   Ht '5\' 3"'  (1.6 m)   Wt 273 lb (123.8 kg)   SpO2 98%   BMI 48.36 kg/m     Physical Exam Vitals reviewed.  Constitutional:      Appearance: Normal appearance.  HENT:     Head: Normocephalic.     Right Ear: Tympanic membrane normal.     Left Ear: Tympanic membrane normal.     Nose: Nose normal.     Mouth/Throat:     Mouth: Mucous membranes are moist.  Eyes:     Pupils: Pupils are equal, round, and reactive to light.  Cardiovascular:     Rate and Rhythm: Normal rate and regular rhythm.  Pulmonary:     Effort: Pulmonary effort is normal.     Breath sounds: Normal breath sounds.  Abdominal:     General: Bowel sounds are normal.     Palpations: Abdomen is soft.  Musculoskeletal:        General: Normal range of motion.     Cervical back: Neck supple.  Skin:    General: Skin is warm and dry.     Capillary Refill: Capillary refill takes less than 2 seconds.  Neurological:     General: No focal deficit present.     Mental Status: She is alert and oriented to person, place, and time.  Psychiatric:        Mood and Affect: Mood normal.        Behavior: Behavior normal.       Assessment & Plan:   1. Mixed hyperlipidemia-well controlled - CBC with Differential/Platelet - Comprehensive metabolic panel - Lipid panel  2. Benign essential hypertension-well controlled - CBC with Differential/Platelet - Comprehensive metabolic panel - T4, free - TSH  3. Prediabetes-well controlled - CBC with Differential/Platelet - Comprehensive metabolic panel - Hemoglobin A1c - metFORMIN (GLUCOPHAGE) 850 MG tablet; Take 1 tablet (850 mg total) by mouth every evening.  Dispense: 90 tablet; Refill: 1  4. Vitamin D deficiency-well controlled - VITAMIN D 25 Hydroxy (Vit-D Deficiency, Fractures) -continue Vit D 5, 000 U daily -continue Vit D rich diet  5. Encounter for  screening mammogram for malignant neoplasm of breast - MM Digital Screening; Future  6. Encounter for immunization - Flu Vaccine MDCK QUAD PF  7. Vasomotor symptoms due to menopause - Fezolinetant (VEOZAH) 45 MG TABS; Take 45 mg by mouth daily.  Dispense: 14 tablet; Refill: 0   Begin Metformin with evening meal Begin Veozah 45 mg daily for hot flashes/night sweats Recommend routine eye exam We will call you with lab results Follow-up in 4-month, fasting   Follow-up: 636-month  I, Katherina A Bramblett, CMA, have  reviewed all documentation for this visit. The documentation on 07/14/22 for the exam, diagnosis, procedures, and orders are all accurate and complete.   Arsenio Katz, CMA  I, Rip Harbour, NP, have reviewed all documentation for this visit. The documentation on 07/14/22 for the exam, diagnosis, procedures, and orders are all accurate and complete.    Signed, Jerrell Belfast, DNP 07/14/22 at 10:19 am

## 2022-07-14 ENCOUNTER — Encounter: Payer: Self-pay | Admitting: Nurse Practitioner

## 2022-07-14 ENCOUNTER — Ambulatory Visit: Payer: BLUE CROSS/BLUE SHIELD | Admitting: Nurse Practitioner

## 2022-07-14 VITALS — BP 128/74 | HR 89 | Temp 96.8°F | Ht 63.0 in | Wt 273.0 lb

## 2022-07-14 DIAGNOSIS — I10 Essential (primary) hypertension: Secondary | ICD-10-CM

## 2022-07-14 DIAGNOSIS — N951 Menopausal and female climacteric states: Secondary | ICD-10-CM

## 2022-07-14 DIAGNOSIS — Z23 Encounter for immunization: Secondary | ICD-10-CM | POA: Diagnosis not present

## 2022-07-14 DIAGNOSIS — E559 Vitamin D deficiency, unspecified: Secondary | ICD-10-CM

## 2022-07-14 DIAGNOSIS — R7303 Prediabetes: Secondary | ICD-10-CM | POA: Diagnosis not present

## 2022-07-14 DIAGNOSIS — Z1231 Encounter for screening mammogram for malignant neoplasm of breast: Secondary | ICD-10-CM

## 2022-07-14 DIAGNOSIS — E782 Mixed hyperlipidemia: Secondary | ICD-10-CM | POA: Diagnosis not present

## 2022-07-14 MED ORDER — VEOZAH 45 MG PO TABS: 45.0000 mg | ORAL_TABLET | Freq: Every day | ORAL | 0 refills | Status: AC

## 2022-07-14 MED ORDER — METFORMIN HCL 850 MG PO TABS: 850.0000 mg | ORAL_TABLET | Freq: Every evening | ORAL | 1 refills | Status: DC

## 2022-07-14 MED ORDER — VEOZAH 45 MG PO TABS: 45.0000 mg | ORAL_TABLET | Freq: Every day | ORAL | 1 refills | Status: AC

## 2022-07-14 NOTE — Patient Instructions (Addendum)
Begin Metformin with evening meal Begin Veozah 45 mg daily for hot flashes/night sweats Recommend routine eye exam We will call you with lab results Follow-up in 31-months, fasting  Health Maintenance for Postmenopausal Women Menopause is a normal process in which your ability to get pregnant comes to an end. This process happens slowly over many months or years, usually between the ages of 62 and 97. Menopause is complete when you have missed your menstrual period for 12 months. It is important to talk with your health care provider about some of the most common conditions that affect women after menopause (postmenopausal women). These include heart disease, cancer, and bone loss (osteoporosis). Adopting a healthy lifestyle and getting preventive care can help to promote your health and wellness. The actions you take can also lower your chances of developing some of these common conditions. What are the signs and symptoms of menopause? During menopause, you may have the following symptoms: Hot flashes. These can be moderate or severe. Night sweats. Decrease in sex drive. Mood swings. Headaches. Tiredness (fatigue). Irritability. Memory problems. Problems falling asleep or staying asleep. Talk with your health care provider about treatment options for your symptoms. Do I need hormone replacement therapy? Hormone replacement therapy is effective in treating symptoms that are caused by menopause, such as hot flashes and night sweats. Hormone replacement carries certain risks, especially as you become older. If you are thinking about using estrogen or estrogen with progestin, discuss the benefits and risks with your health care provider. How can I reduce my risk for heart disease and stroke? The risk of heart disease, heart attack, and stroke increases as you age. One of the causes may be a change in the body's hormones during menopause. This can affect how your body uses dietary fats,  triglycerides, and cholesterol. Heart attack and stroke are medical emergencies. There are many things that you can do to help prevent heart disease and stroke. Watch your blood pressure High blood pressure causes heart disease and increases the risk of stroke. This is more likely to develop in people who have high blood pressure readings or are overweight. Have your blood pressure checked: Every 3-5 years if you are 30-48 years of age. Every year if you are 68 years old or older. Eat a healthy diet  Eat a diet that includes plenty of vegetables, fruits, low-fat dairy products, and lean protein. Do not eat a lot of foods that are high in solid fats, added sugars, or sodium. Get regular exercise Get regular exercise. This is one of the most important things you can do for your health. Most adults should: Try to exercise for at least 150 minutes each week. The exercise should increase your heart rate and make you sweat (moderate-intensity exercise). Try to do strengthening exercises at least twice each week. Do these in addition to the moderate-intensity exercise. Spend less time sitting. Even light physical activity can be beneficial. Other tips Work with your health care provider to achieve or maintain a healthy weight. Do not use any products that contain nicotine or tobacco. These products include cigarettes, chewing tobacco, and vaping devices, such as e-cigarettes. If you need help quitting, ask your health care provider. Know your numbers. Ask your health care provider to check your cholesterol and your blood sugar (glucose). Continue to have your blood tested as directed by your health care provider. Do I need screening for cancer? Depending on your health history and family history, you may need to have cancer screenings  at different stages of your life. This may include screening for: Breast cancer. Cervical cancer. Lung cancer. Colorectal cancer. What is my risk for  osteoporosis? After menopause, you may be at increased risk for osteoporosis. Osteoporosis is a condition in which bone destruction happens more quickly than new bone creation. To help prevent osteoporosis or the bone fractures that can happen because of osteoporosis, you may take the following actions: If you are 61-92 years old, get at least 1,000 mg of calcium and at least 600 international units (IU) of vitamin D per day. If you are older than age 43 but younger than age 8, get at least 1,200 mg of calcium and at least 600 international units (IU) of vitamin D per day. If you are older than age 44, get at least 1,200 mg of calcium and at least 800 international units (IU) of vitamin D per day. Smoking and drinking excessive alcohol increase the risk of osteoporosis. Eat foods that are rich in calcium and vitamin D, and do weight-bearing exercises several times each week as directed by your health care provider. How does menopause affect my mental health? Depression may occur at any age, but it is more common as you become older. Common symptoms of depression include: Feeling depressed. Changes in sleep patterns. Changes in appetite or eating patterns. Feeling an overall lack of motivation or enjoyment of activities that you previously enjoyed. Frequent crying spells. Talk with your health care provider if you think that you are experiencing any of these symptoms. General instructions See your health care provider for regular wellness exams and vaccines. This may include: Scheduling regular health, dental, and eye exams. Getting and maintaining your vaccines. These include: Influenza vaccine. Get this vaccine each year before the flu season begins. Pneumonia vaccine. Shingles vaccine. Tetanus, diphtheria, and pertussis (Tdap) booster vaccine. Your health care provider may also recommend other immunizations. Tell your health care provider if you have ever been abused or do not feel safe at  home. Summary Menopause is a normal process in which your ability to get pregnant comes to an end. This condition causes hot flashes, night sweats, decreased interest in sex, mood swings, headaches, or lack of sleep. Treatment for this condition may include hormone replacement therapy. Take actions to keep yourself healthy, including exercising regularly, eating a healthy diet, watching your weight, and checking your blood pressure and blood sugar levels. Get screened for cancer and depression. Make sure that you are up to date with all your vaccines. This information is not intended to replace advice given to you by your health care provider. Make sure you discuss any questions you have with your health care provider. Document Revised: 02/25/2021 Document Reviewed: 02/25/2021 Elsevier Patient Education  2023 Else[employer] Inc.    Prediabetes Prediabetes is when your blood sugar (blood glucose) level is higher than normal but not high enough for you to be diagnosed with type 2 diabetes. Having prediabetes puts you at risk for developing type 2 diabetes (type 2 diabetes mellitus). With certain lifestyle changes, you may be able to prevent or delay the onset of type 2 diabetes. This is important because type 2 diabetes can lead to serious complications, such as: Heart disease. Stroke. Blindness. Kidney disease. Depression. Poor circulation in the feet and legs. In severe cases, this could lead to surgical removal of a leg (amputation). What are the causes? The exact cause of prediabetes is not known. It may result from insulin resistance. Insulin resistance develops when cells in the  body do not respond properly to insulin that the body makes. This can cause excess glucose to build up in the blood. High blood glucose (hyperglycemia) can develop. What increases the risk? The following factors may make you more likely to develop this condition: You have a family member with type 2 diabetes. You  are older than 45 years. You had a temporary form of diabetes during a pregnancy (gestational diabetes). You had polycystic ovary syndrome (PCOS). You are overweight or obese. You are inactive (sedentary). You have a history of heart disease, including problems with cholesterol levels, high levels of blood fats, or high blood pressure. What are the signs or symptoms? You may have no symptoms. If you do have symptoms, they may include: Increased hunger. Increased thirst. Increased urination. Vision changes, such as blurry vision. Tiredness (fatigue). How is this diagnosed? This condition can be diagnosed with blood tests. Your blood glucose may be checked with one or more of the following tests: A fasting blood glucose (FBG) test. You will not be allowed to eat (you will fast) for at least 8 hours before a blood sample is taken. An A1C blood test (hemoglobin A1C). This test provides information about blood glucose levels over the previous 2?3 months. An oral glucose tolerance test (OGTT). This test measures your blood glucose at two points in time: After fasting. This is your baseline level. Two hours after you drink a beverage that contains glucose. You may be diagnosed with prediabetes if: Your FBG is 100?125 mg/dL (5.6-6.9 mmol/L). Your A1C level is 5.7?6.4% (39-46 mmol/mol). Your OGTT result is 140?199 mg/dL (7.8-11 mmol/L). These blood tests may be repeated to confirm your diagnosis. How is this treated? Treatment may include dietary and lifestyle changes to help lower your blood glucose and prevent type 2 diabetes from developing. In some cases, medicine may be prescribed to help lower the risk of type 2 diabetes. Follow these instructions at home: Nutrition  Follow a healthy meal plan. This includes eating lean proteins, whole grains, legumes, fresh fruits and vegetables, low-fat dairy products, and healthy fats. Follow instructions from your health care provider about eating or  drinking restrictions. Meet with a dietitian to create a healthy eating plan that is right for you. Lifestyle Do moderate-intensity exercise for at least 30 minutes a day on 5 or more days each week, or as told by your health care provider. A mix of activities may be best, such as: Brisk walking, swimming, biking, and weight lifting. Lose weight as told by your health care provider. Losing 5-7% of your body weight can reverse insulin resistance. Do not drink alcohol if: Your health care provider tells you not to drink. You are pregnant, may be pregnant, or are planning to become pregnant. If you drink alcohol: Limit how much you use to: 0-1 drink a day for women. 0-2 drinks a day for men. Be aware of how much alcohol is in your drink. In the [place] one drink equals one 12 oz bottle of beer (355 mL), one 5 oz glass of wine (148 mL), or one 1 oz glass of hard liquor (44 mL). General instructions Take over-the-counter and prescription medicines only as told by your health care provider. You may be prescribed medicines that help lower the risk of type 2 diabetes. Do not use any products that contain nicotine or tobacco, such as cigarettes, e-cigarettes, and chewing tobacco. If you need help quitting, ask your health care provider. Keep all follow-up visits. This is important. Where to  find more information American Diabetes Association: www.diabetes.org Academy of Nutrition and Dietetics: www.eatright.org American Heart Association: www.heart.org Contact a health care provider if: You have any of these symptoms: Increased hunger. Increased urination. Increased thirst. Fatigue. Vision changes, such as blurry vision. Get help right away if you: Have shortness of breath. Feel confused. Vomit or feel like you may vomit. Summary Prediabetes is when your blood sugar (blood glucose)level is higher than normal but not high enough for you to be diagnosed with type 2 diabetes. Having prediabetes  puts you at risk for developing type 2 diabetes (type 2 diabetes mellitus). Make lifestyle changes such as eating a healthy diet and exercising regularly to help prevent diabetes. Lose weight as told by your health care provider. This information is not intended to replace advice given to you by your health care provider. Make sure you discuss any questions you have with your health care provider. Document Revised: 01/05/2020 Document Reviewed: 01/05/2020 Elsevier Patient Education  2023 Elsevier Inc.   Prediabetes Eating Plan Prediabetes is a condition that causes blood sugar (glucose) levels to be higher than normal. This increases the risk for developing type 2 diabetes (type 2 diabetes mellitus). Working with a health care provider or nutrition specialist (dietitian) to make diet and lifestyle changes can help prevent the onset of diabetes. These changes may help you: Control your blood glucose levels. Improve your cholesterol levels. Manage your blood pressure. What are tips for following this plan? Reading food labels Read food labels to check the amount of fat, salt (sodium), and sugar in prepackaged foods. Avoid foods that have: Saturated fats. Trans fats. Added sugars. Avoid foods that have more than 300 milligrams (mg) of sodium per serving. Limit your sodium intake to less than 2,300 mg each day. Shopping Avoid buying pre-made and processed foods. Avoid buying drinks with added sugar. Cooking Cook with olive oil. Do not use butter, lard, or ghee. Bake, broil, grill, steam, or boil foods. Avoid frying. Meal planning  Work with your dietitian to create an eating plan that is right for you. This may include tracking how many calories you take in each day. Use a food diary, notebook, or mobile application to track what you eat at each meal. Consider following a Mediterranean diet. This includes: Eating several servings of fresh fruits and vegetables each day. Eating fish at  least twice a week. Eating one serving each day of whole grains, beans, nuts, and seeds. Using olive oil instead of other fats. Limiting alcohol. Limiting red meat. Using nonfat or low-fat dairy products. Consider following a plant-based diet. This includes dietary choices that focus on eating mostly vegetables and fruit, grains, beans, nuts, and seeds. If you have high blood pressure, you may need to limit your sodium intake or follow a diet such as the DASH (Dietary Approaches to Stop Hypertension) eating plan. The DASH diet aims to lower high blood pressure. Lifestyle Set weight loss goals with help from your health care team. It is recommended that most people with prediabetes lose 7% of their body weight. Exercise for at least 30 minutes 5 or more days a week. Attend a support group or seek support from a mental health counselor. Take over-the-counter and prescription medicines only as told by your health care provider. What foods are recommended? Fruits Berries. Bananas. Apples. Oranges. Grapes. Papaya. Mango. Pomegranate. Kiwi. Grapefruit. Cherries. Vegetables Lettuce. Spinach. Peas. Beets. Cauliflower. Cabbage. Broccoli. Carrots. Tomatoes. Squash. Eggplant. Herbs. Peppers. Onions. Cucumbers. Brussels sprouts. Grains Whole grains, such as  whole-wheat or whole-grain breads, crackers, cereals, and pasta. Unsweetened oatmeal. Bulgur. Barley. Quinoa. Brown rice. Corn or whole-wheat flour tortillas or taco shells. Meats and other proteins Seafood. Poultry without skin. Lean cuts of pork and beef. Tofu. Eggs. Nuts. Beans. Dairy Low-fat or fat-free dairy products, such as yogurt, cottage cheese, and cheese. Beverages Water. Tea. Coffee. Sugar-free or diet soda. Seltzer water. Low-fat or nonfat milk. Milk alternatives, such as soy or almond milk. Fats and oils Olive oil. Canola oil. Sunflower oil. Grapeseed oil. Avocado. Walnuts. Sweets and desserts Sugar-free or low-fat pudding.  Sugar-free or low-fat ice cream and other frozen treats. Seasonings and condiments Herbs. Sodium-free spices. Mustard. Relish. Low-salt, low-sugar ketchup. Low-salt, low-sugar barbecue sauce. Low-fat or fat-free mayonnaise. The items listed above may not be a complete list of recommended foods and beverages. Contact a dietitian for more information. What foods are not recommended? Fruits Fruits canned with syrup. Vegetables Canned vegetables. Frozen vegetables with butter or cream sauce. Grains Refined white flour and flour products, such as bread, pasta, snack foods, and cereals. Meats and other proteins Fatty cuts of meat. Poultry with skin. Breaded or fried meat. Processed meats. Dairy Full-fat yogurt, cheese, or milk. Beverages Sweetened drinks, such as iced tea and soda. Fats and oils Butter. Lard. Ghee. Sweets and desserts Baked goods, such as cake, cupcakes, pastries, cookies, and cheesecake. Seasonings and condiments Spice mixes with added salt. Ketchup. Barbecue sauce. Mayonnaise. The items listed above may not be a complete list of foods and beverages that are not recommended. Contact a dietitian for more information. Where to find more information American Diabetes Association: www.diabetes.org Summary You may need to make diet and lifestyle changes to help prevent the onset of diabetes. These changes can help you control blood sugar, improve cholesterol levels, and manage blood pressure. Set weight loss goals with help from your health care team. It is recommended that most people with prediabetes lose 7% of their body weight. Consider following a Mediterranean diet. This includes eating plenty of fresh fruits and vegetables, whole grains, beans, nuts, seeds, fish, and low-fat dairy, and using olive oil instead of other fats. This information is not intended to replace advice given to you by your health care provider. Make sure you discuss any questions you have with your  health care provider. Document Revised: 01/05/2020 Document Reviewed: 01/05/2020 Elsevier Patient Education  2023 [employer]

## 2022-07-15 LAB — CBC WITH DIFFERENTIAL/PLATELET
Basophils Absolute: 0 10*3/uL (ref 0.0–0.2)
Basos: 0 %
EOS (ABSOLUTE): 0.1 10*3/uL (ref 0.0–0.4)
Eos: 2 %
Hematocrit: 45.5 % (ref 34.0–46.6)
Hemoglobin: 14.7 g/dL (ref 11.1–15.9)
Immature Grans (Abs): 0 10*3/uL (ref 0.0–0.1)
Immature Granulocytes: 0 %
Lymphocytes Absolute: 3.5 10*3/uL — ABNORMAL HIGH (ref 0.7–3.1)
Lymphs: 36 %
MCH: 30.3 pg (ref 26.6–33.0)
MCHC: 32.3 g/dL (ref 31.5–35.7)
MCV: 94 fL (ref 79–97)
Monocytes Absolute: 0.6 10*3/uL (ref 0.1–0.9)
Monocytes: 7 %
Neutrophils Absolute: 5.2 10*3/uL (ref 1.4–7.0)
Neutrophils: 55 %
Platelets: 325 10*3/uL (ref 150–450)
RBC: 4.85 x10E6/uL (ref 3.77–5.28)
RDW: 12 % (ref 11.7–15.4)
WBC: 9.5 10*3/uL (ref 3.4–10.8)

## 2022-07-15 LAB — COMPREHENSIVE METABOLIC PANEL
ALT: 33 IU/L — ABNORMAL HIGH (ref 0–32)
AST: 20 IU/L (ref 0–40)
Albumin/Globulin Ratio: 1.8 (ref 1.2–2.2)
Albumin: 4.6 g/dL (ref 3.8–4.9)
Alkaline Phosphatase: 60 IU/L (ref 44–121)
BUN/Creatinine Ratio: 15 (ref 9–23)
BUN: 13 mg/dL (ref 6–24)
Bilirubin Total: 0.6 mg/dL (ref 0.0–1.2)
CO2: 25 mmol/L (ref 20–29)
Calcium: 10.9 mg/dL — ABNORMAL HIGH (ref 8.7–10.2)
Chloride: 101 mmol/L (ref 96–106)
Creatinine, Ser: 0.84 mg/dL (ref 0.57–1.00)
Globulin, Total: 2.5 g/dL (ref 1.5–4.5)
Glucose: 119 mg/dL — ABNORMAL HIGH (ref 70–99)
Potassium: 5 mmol/L (ref 3.5–5.2)
Sodium: 143 mmol/L (ref 134–144)
Total Protein: 7.1 g/dL (ref 6.0–8.5)
eGFR: 83 mL/min/{1.73_m2} (ref >59)

## 2022-07-15 LAB — CARDIOVASCULAR RISK ASSESSMENT

## 2022-07-15 LAB — LIPID PANEL
Chol/HDL Ratio: 2.4 ratio (ref 0.0–4.4)
Cholesterol, Total: 163 mg/dL (ref 100–199)
HDL: 67 mg/dL (ref >39)
LDL Chol Calc (NIH): 75 mg/dL (ref 0–99)
Triglycerides: 121 mg/dL (ref 0–149)
VLDL Cholesterol Cal: 21 mg/dL (ref 5–40)

## 2022-07-15 LAB — HEMOGLOBIN A1C
Est. average glucose Bld gHb Est-mCnc: 134 mg/dL
Hgb A1c MFr Bld: 6.3 % — ABNORMAL HIGH (ref 4.8–5.6)

## 2022-07-15 LAB — T4, FREE: Free T4: 0.95 ng/dL (ref 0.82–1.77)

## 2022-07-15 LAB — TSH: TSH: 1.53 u[IU]/mL (ref 0.450–4.500)

## 2022-07-15 LAB — VITAMIN D 25 HYDROXY (VIT D DEFICIENCY, FRACTURES): Vit D, 25-Hydroxy: 54.1 ng/mL (ref 30.0–100.0)

## 2022-07-21 ENCOUNTER — Telehealth: Payer: Self-pay

## 2022-07-21 NOTE — Telephone Encounter (Signed)
PA submitted and denied via covermymeds for veozah see scanned documents for further detail.

## 2022-08-06 ENCOUNTER — Telehealth: Payer: Self-pay

## 2022-08-06 NOTE — Telephone Encounter (Signed)
Patient left voicemail stating veozah is not working nor covered by her insurance. Also states she is not taking a calcium supplement but does take a multivitamin.

## 2022-08-07 NOTE — Telephone Encounter (Signed)
Left message advising patient to d/c medication as advised.

## 2022-09-22 ENCOUNTER — Other Ambulatory Visit: Payer: Self-pay

## 2022-09-22 DIAGNOSIS — R7303 Prediabetes: Secondary | ICD-10-CM

## 2022-09-22 DIAGNOSIS — I10 Essential (primary) hypertension: Secondary | ICD-10-CM

## 2022-09-22 DIAGNOSIS — E782 Mixed hyperlipidemia: Secondary | ICD-10-CM

## 2022-09-22 MED ORDER — METFORMIN HCL 850 MG PO TABS: 850.0000 mg | ORAL_TABLET | Freq: Every evening | ORAL | 1 refills | Status: AC

## 2022-09-22 MED ORDER — LISINOPRIL 20 MG PO TABS: ORAL_TABLET | ORAL | 1 refills | Status: DC

## 2022-09-22 MED ORDER — ROSUVASTATIN CALCIUM 20 MG PO TABS: 20.0000 mg | ORAL_TABLET | Freq: Every day | ORAL | 1 refills | Status: AC

## 2022-09-22 MED ORDER — CHLORTHALIDONE 25 MG PO TABS: ORAL_TABLET | ORAL | 1 refills | Status: DC

## 2022-09-24 ENCOUNTER — Other Ambulatory Visit: Payer: Self-pay | Admitting: Nurse Practitioner

## 2022-09-24 DIAGNOSIS — I10 Essential (primary) hypertension: Secondary | ICD-10-CM

## 2022-12-16 ENCOUNTER — Telehealth: Payer: Self-pay

## 2022-12-16 NOTE — Telephone Encounter (Signed)
I left a message on the number(s) listed in the patients chart requesting the patient to call back regarding the [facility] appointment for 01/13/2023. The appointment has been canceled. Waiting for the patient to return the call.  This appointment will need to be reschedule with Dr. Tobie Poet or Gay Filler near the original appointment date. Please make sure this appointment is 40 minutes for the first appointment.

## 2022-12-17 NOTE — Telephone Encounter (Signed)
Patient notified that Rekha's last day with our office will be 01/09/2023. Patient stated that she thinks her insurance is OON. Patient is going to check and call us back if it is not OON.  If the patient calls back she will need to be rescheduled near the original appointment with Gay Filler or Dr. Tobie Poet

## 2023-01-13 ENCOUNTER — Ambulatory Visit: Payer: BLUE CROSS/BLUE SHIELD | Admitting: Nurse Practitioner

## 2023-02-13 ENCOUNTER — Ambulatory Visit: Payer: BLUE CROSS/BLUE SHIELD | Admitting: Nurse Practitioner

## 2023-03-23 ENCOUNTER — Other Ambulatory Visit: Payer: Self-pay | Admitting: Family Medicine

## 2023-03-23 ENCOUNTER — Other Ambulatory Visit: Payer: Self-pay

## 2023-03-23 DIAGNOSIS — I10 Essential (primary) hypertension: Secondary | ICD-10-CM

## 2023-03-23 MED ORDER — CHLORTHALIDONE 25 MG PO TABS: ORAL_TABLET | ORAL | 0 refills | Status: AC

## 2023-04-22 ENCOUNTER — Other Ambulatory Visit: Payer: Self-pay | Admitting: Family Medicine

## 2023-04-22 DIAGNOSIS — I10 Essential (primary) hypertension: Secondary | ICD-10-CM

## 2023-06-17 ENCOUNTER — Other Ambulatory Visit: Payer: Self-pay

## 2023-09-21 ENCOUNTER — Other Ambulatory Visit: Payer: Self-pay
